# Patient Record
Sex: Female | Born: 1955 | Marital: Married | Smoking: Never smoker
Health system: Southern US, Community
[De-identification: ages and names within clinical notes are randomized; demographics above are authoritative.]

## PROBLEM LIST (undated history)

## (undated) DIAGNOSIS — E119 Type 2 diabetes mellitus without complications: Secondary | ICD-10-CM

## (undated) HISTORY — PX: SHOULDER SURGERY: SHX246

## (undated) HISTORY — DX: Type 2 diabetes mellitus without complications: E11.9

---

## 2012-05-05 ENCOUNTER — Encounter (HOSPITAL_COMMUNITY): Payer: Self-pay | Admitting: *Deleted

## 2012-05-05 ENCOUNTER — Emergency Department (HOSPITAL_COMMUNITY): Payer: Self-pay

## 2012-05-05 ENCOUNTER — Emergency Department (HOSPITAL_COMMUNITY)
Admission: EM | Admit: 2012-05-05 | Discharge: 2012-05-05 | Disposition: A | Discharge status: Home / Self Care | Payer: Self-pay | Attending: Emergency Medicine | Admitting: Emergency Medicine

## 2012-05-05 DIAGNOSIS — R05 Cough: Secondary | ICD-10-CM

## 2012-05-05 DIAGNOSIS — R053 Chronic cough: Secondary | ICD-10-CM

## 2012-05-05 DIAGNOSIS — R059 Cough, unspecified: Secondary | ICD-10-CM | POA: Insufficient documentation

## 2012-05-05 LAB — URINALYSIS, ROUTINE W REFLEX MICROSCOPIC
Bilirubin Urine: NEGATIVE
Glucose, UA: NEGATIVE mg/dL
Hgb urine dipstick: NEGATIVE
Ketones, ur: NEGATIVE mg/dL
Leukocytes, UA: NEGATIVE
Nitrite: NEGATIVE
Protein, ur: NEGATIVE mg/dL
Specific Gravity, Urine: 1.008 (ref 1.005–1.030)
Urobilinogen, UA: 0.2 mg/dL (ref 0.0–1.0)
pH: 5.5 (ref 5.0–8.0)

## 2012-05-05 LAB — COMPREHENSIVE METABOLIC PANEL WITH GFR
ALT: 18 U/L (ref 0–35)
AST: 31 U/L (ref 0–37)
Albumin: 4.3 g/dL (ref 3.5–5.2)
Alkaline Phosphatase: 77 U/L (ref 39–117)
BUN: 8 mg/dL (ref 6–23)
CO2: 22 meq/L (ref 19–32)
Calcium: 9.8 mg/dL (ref 8.4–10.5)
Chloride: 100 meq/L (ref 96–112)
Creatinine, Ser: 0.71 mg/dL (ref 0.50–1.10)
GFR calc Af Amer: >90 mL/min (ref >90)
GFR calc non Af Amer: >90 mL/min (ref >90)
Glucose, Bld: 98 mg/dL (ref 70–99)
Potassium: 4.3 meq/L (ref 3.5–5.1)
Sodium: 136 meq/L (ref 135–145)
Total Bilirubin: 0.7 mg/dL (ref 0.3–1.2)
Total Protein: 7.7 g/dL (ref 6.0–8.3)

## 2012-05-05 LAB — CBC WITH DIFFERENTIAL/PLATELET
Basophils Absolute: 0.1 10*3/uL (ref 0.0–0.1)
Basophils Relative: 1 % (ref 0–1)
Eosinophils Absolute: 1.1 10*3/uL — ABNORMAL HIGH (ref 0.0–0.7)
Eosinophils Relative: 12 % — ABNORMAL HIGH (ref 0–5)
HCT: 38.4 % (ref 36.0–46.0)
Hemoglobin: 13.8 g/dL (ref 12.0–15.0)
Lymphocytes Relative: 34 % (ref 12–46)
Lymphs Abs: 3.2 10*3/uL (ref 0.7–4.0)
MCH: 30.7 pg (ref 26.0–34.0)
MCHC: 35.9 g/dL (ref 30.0–36.0)
MCV: 85.3 fL (ref 78.0–100.0)
Monocytes Absolute: 0.5 10*3/uL (ref 0.1–1.0)
Monocytes Relative: 5 % (ref 3–12)
Neutro Abs: 4.6 10*3/uL (ref 1.7–7.7)
Neutrophils Relative %: 48 % (ref 43–77)
Platelets: 219 10*3/uL (ref 150–400)
RBC: 4.5 MIL/uL (ref 3.87–5.11)
RDW: 12.7 % (ref 11.5–15.5)
WBC: 9.4 10*3/uL (ref 4.0–10.5)

## 2012-05-05 MED ORDER — SODIUM CHLORIDE 0.9 % IV BOLUS (SEPSIS)
1000.0000 mL | Freq: Once | INTRAVENOUS | Status: AC
  Administered 2012-05-05: 1000 mL via INTRAVENOUS

## 2012-05-05 MED ORDER — IPRATROPIUM BROMIDE 0.02 % IN SOLN
0.5000 mg | Freq: Once | RESPIRATORY_TRACT | Status: AC
  Administered 2012-05-05: 0.5 mg via RESPIRATORY_TRACT
  Filled 2012-05-05: qty 2.5

## 2012-05-05 MED ORDER — PREDNISONE 50 MG PO TABS: 50.0000 mg | ORAL_TABLET | Freq: Every day | ORAL | Status: AC

## 2012-05-05 MED ORDER — PREDNISONE 20 MG PO TABS
60.0000 mg | ORAL_TABLET | Freq: Once | ORAL | Status: AC
  Administered 2012-05-05: 60 mg via ORAL
  Filled 2012-05-05: qty 3

## 2012-05-05 MED ORDER — ALBUTEROL SULFATE (5 MG/ML) 0.5% IN NEBU
5.0000 mg | INHALATION_SOLUTION | Freq: Once | RESPIRATORY_TRACT | Status: AC
  Administered 2012-05-05: 5 mg via RESPIRATORY_TRACT
  Filled 2012-05-05: qty 40

## 2012-05-05 MED ORDER — ALBUTEROL SULFATE HFA 108 (90 BASE) MCG/ACT IN AERS: 2.0000 | INHALATION_SPRAY | RESPIRATORY_TRACT | Status: DC | PRN

## 2012-05-05 MED ORDER — PROMETHAZINE-DM 6.25-15 MG/5ML PO SYRP: 5.0000 mL | ORAL_SOLUTION | Freq: Four times a day (QID) | ORAL | Status: AC | PRN

## 2012-05-05 MED ORDER — ESOMEPRAZOLE MAGNESIUM 40 MG PO CPDR: 40.0000 mg | DELAYED_RELEASE_CAPSULE | Freq: Every day | ORAL | Status: DC

## 2012-05-05 NOTE — ED Provider Notes (Signed)
History     CSN: [id number]  Arrival date & time 05/05/12  7829   First MD Initiated Contact with Patient 05/05/12 1002      Chief Complaint  Patient presents with  . Cough    (Consider location/radiation/quality/duration/timing/severity/associated sxs/prior treatment) HPI Patient has been having nonproductive cough for the last 3 months patient, states that she has not had any relief of antibiotics or cough suppressants.  Patient has tried over-the-counter remedies without relief.  Patient denies, chest pain, back pain, fever, nasal congestion, rhinorrhea, ear pain, nausea, and vomiting, diarrhea, abdominal pain, or dizziness.  History reviewed. No pertinent past medical history.  History reviewed. No pertinent past surgical history.  History reviewed. No pertinent family history.  History  Substance Use Topics  . Smoking status: Not on file  . Smokeless tobacco: Not on file  . Alcohol Use: No    OB History    Grav Para Term Preterm Abortions TAB SAB Ect Mult Living                  Review of Systems  Constitutional: Negative for fever, chills, diaphoresis and fatigue.  HENT: Negative for ear pain, nosebleeds, congestion, rhinorrhea, sneezing, neck pain, neck stiffness and postnasal drip.   Eyes: Negative for visual disturbance.  Respiratory: Positive for cough. Negative for apnea, choking, chest tightness, shortness of breath, wheezing and stridor.   Cardiovascular: Negative for chest pain.  Gastrointestinal: Negative for nausea, vomiting, abdominal pain, diarrhea and constipation.  Musculoskeletal: Positive for myalgias. Negative for back pain and arthralgias.  Skin: Negative for color change and rash.  Neurological: Negative for syncope, weakness, light-headedness and numbness.    Allergies  Review of patient's allergies indicates no known allergies.  Home Medications   Current Outpatient Rx  Name Route Sig Dispense Refill  . GUAIFENESIN ER 600 MG PO TB12  Oral Take 600 mg by mouth 2 (two) times daily as needed. For cough    . OVER THE COUNTER MEDICATION Oral Take 30 mLs by mouth 3 (three) times daily as needed. For cough      BP 123/80  Pulse 75  Temp 97.7 F (36.5 C) (Oral)  Resp 20  SpO2 99%  Physical Exam  Nursing note and vitals reviewed. Constitutional: She is oriented to person, place, and time. She appears well-developed and well-nourished.  HENT:  Head: Normocephalic and atraumatic.  Mouth/Throat: Oropharynx is clear and moist.  Neck: Normal range of motion. Neck supple.  Cardiovascular: Normal rate, regular rhythm and normal heart sounds.  Exam reveals no gallop and no friction rub.   No murmur heard. Pulmonary/Chest: Effort normal. No respiratory distress. She has no wheezes. She has no rales.       Patient has some mild congestive type sounds bilaterally. NO rhonchi or wheeze noted.  Neurological: She is alert and oriented to person, place, and time.  Skin: Skin is warm and dry. No rash noted.    ED Course  Procedures (including critical care time)  Labs Reviewed  CBC WITH DIFFERENTIAL - Abnormal; Notable for the following:    Eosinophils Relative 12 (*)     Eosinophils Absolute 1.1 (*)     All other components within normal limits  COMPREHENSIVE METABOLIC PANEL  URINALYSIS, ROUTINE W REFLEX MICROSCOPIC   Dg Chest 2 View  05/05/2012  *RADIOLOGY REPORT*  Clinical Data: Cough and chest pain off and on for the past 2-3 months.  CHEST - 2 VIEW  Comparison: No priors.  Findings: Lung volumes  are normal.  No consolidative airspace disease.  No pleural effusions.  No pneumothorax.  No pulmonary nodule or mass noted.  Pulmonary vasculature and the cardiomediastinal silhouette are within normal limits.  IMPRESSION: 1. No radiographic evidence of acute cardiopulmonary disease.  Original Report Authenticated By: Florencia Reasons, M.D.     Patient is referred to Pulmonary for further care and evaluation. She is treated with  albuterol steroids and nexium. Patient is advised to return here. The plan is given to the patient and all questions answered.  MDM    Date: 05/09/2012  Rate:67  Rhythm: normal sinus rhythm  QRS Axis: normal  Intervals: normal  ST/T Wave abnormalities: normal  Conduction Disutrbances:none  Narrative Interpretation:   Old EKG Reviewed: none available         Carlyle Dolly, PA-C 05/09/12 [phone]

## 2012-05-05 NOTE — ED Notes (Signed)
Pt reports having non productive cough x 3 months. Has become severe, having urinary incontinence when coughing. Airway is intact at triage, spo2 97%.

## 2012-05-16 NOTE — ED Provider Notes (Signed)
Medical screening examination/treatment/procedure(s) were performed by non-physician practitioner and as supervising physician I was immediately available for consultation/collaboration.  Lasha Echeverria M Darcey Demma, MD 05/16/12 1930 

## 2013-08-10 IMAGING — CR DG CHEST 2V
2 series · 2 of 2 positions shown · non-contrast
Comparison: No priors.

CLINICAL DATA: Cough and chest pain off and on for the past 2-3
months.

CHEST - 2 VIEW

[w chest pa]
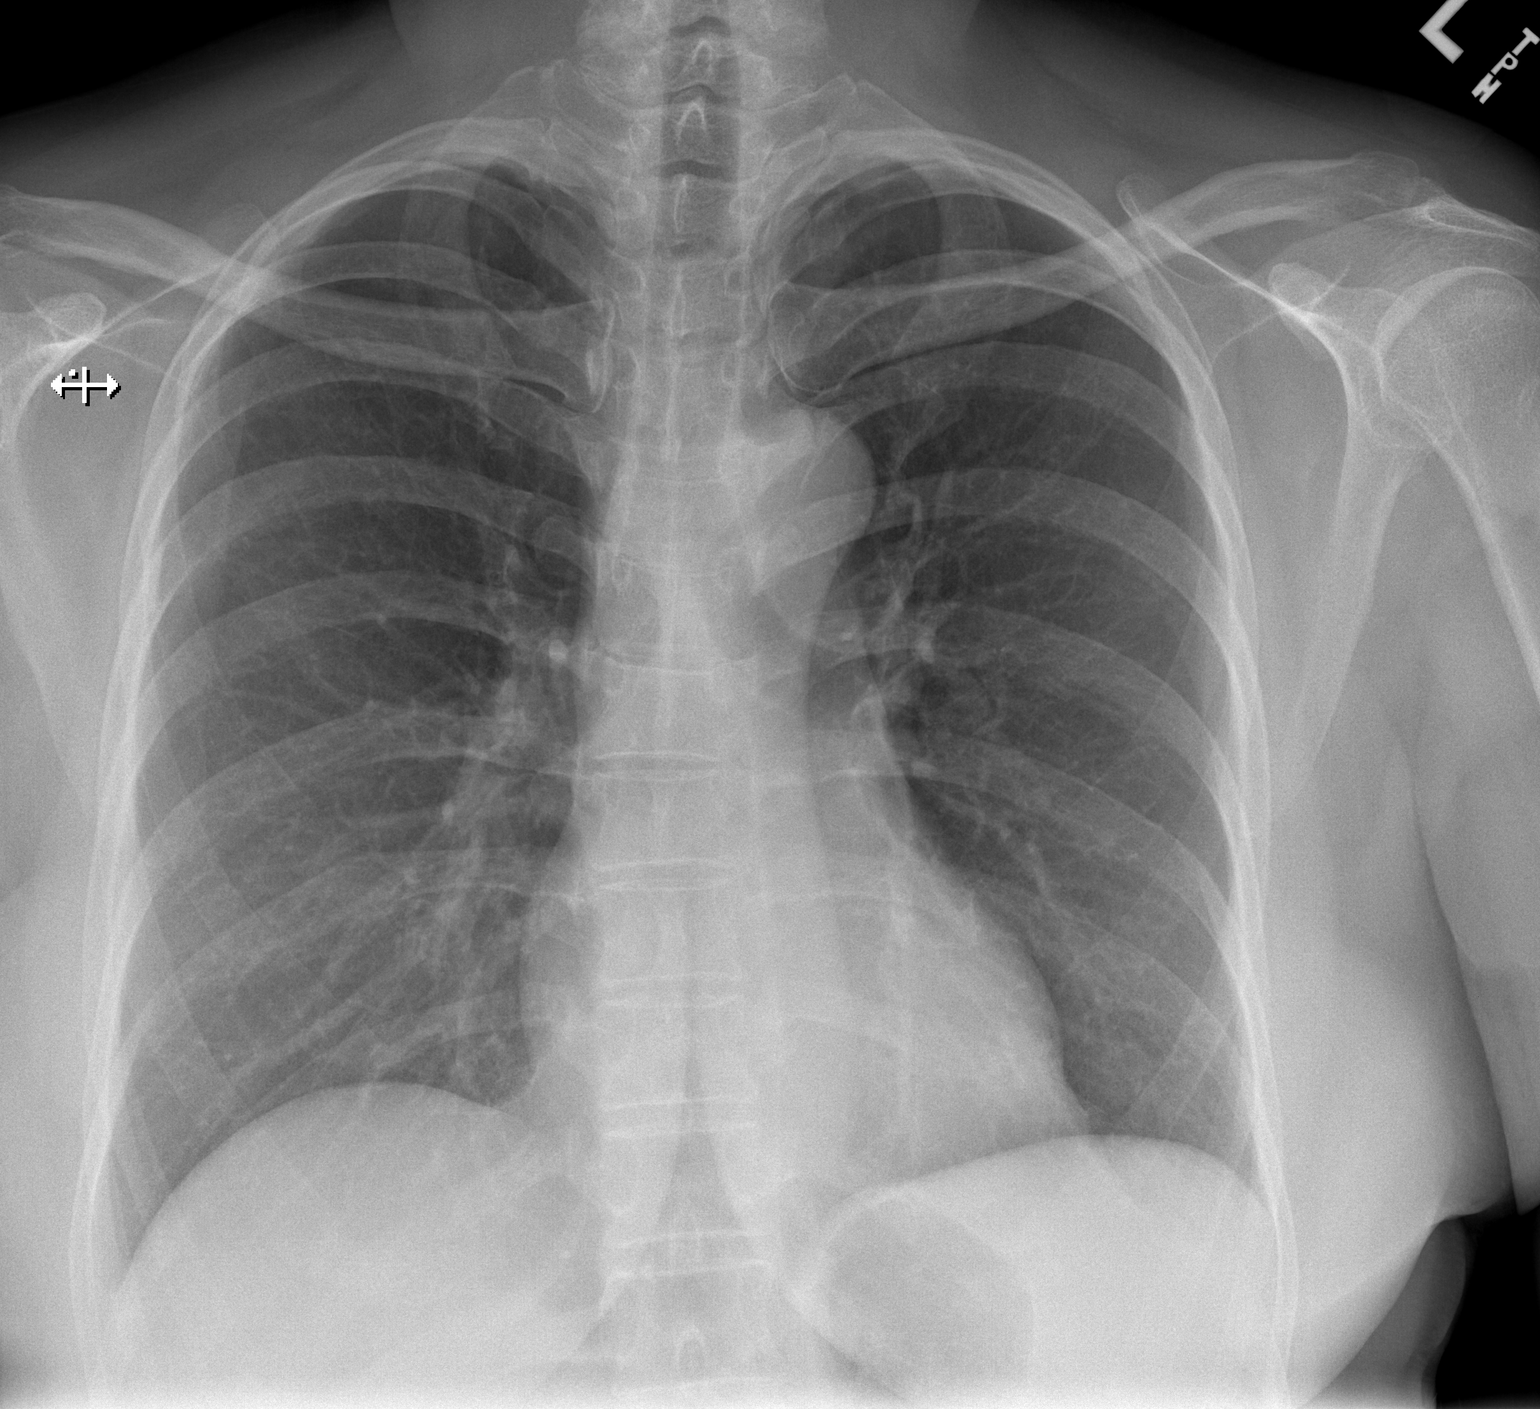

[w chest lat]
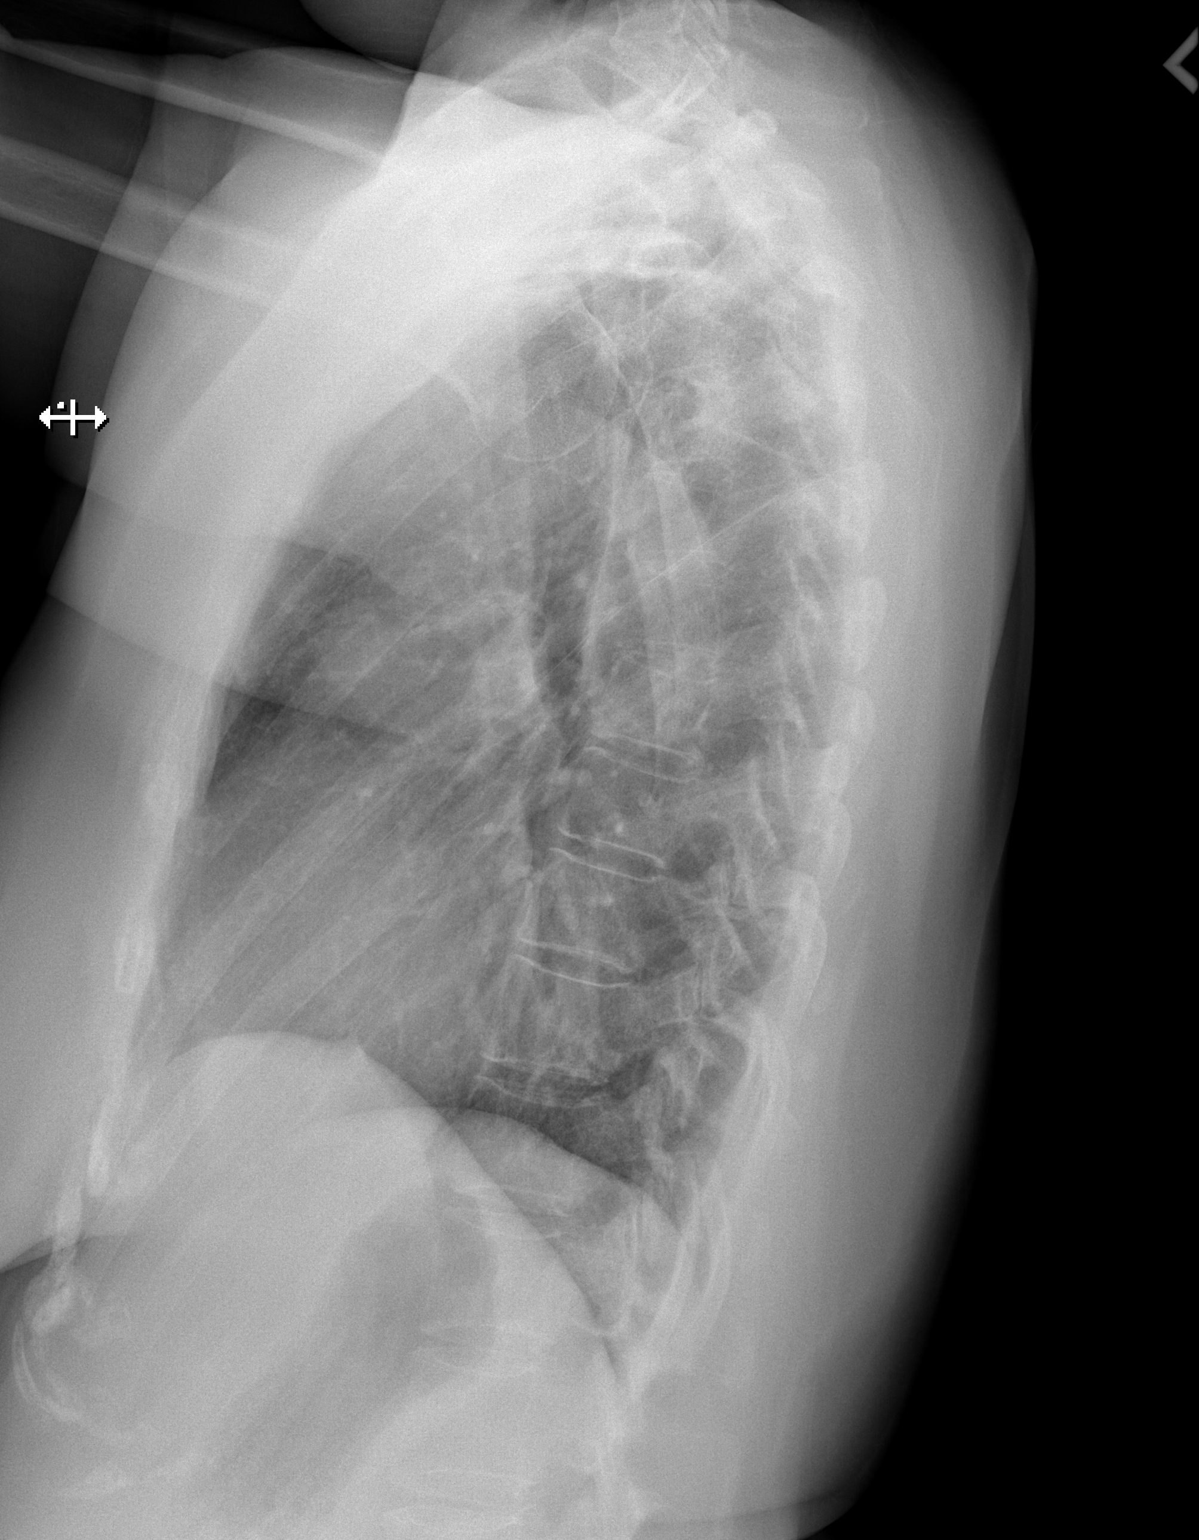

[2 of 2 positions shown; findings below may reference images not displayed]

FINDINGS: Lung volumes are normal.  No consolidative airspace
disease.  No pleural effusions.  No pneumothorax.  No pulmonary
nodule or mass noted.  Pulmonary vasculature and the
cardiomediastinal silhouette are within normal limits.
IMPRESSION: 1. No radiographic evidence of acute cardiopulmonary disease.

## 2015-04-24 ENCOUNTER — Ambulatory Visit: Payer: Self-pay

## 2015-05-19 ENCOUNTER — Ambulatory Visit: Payer: Self-pay | Attending: Internal Medicine

## 2015-07-13 ENCOUNTER — Ambulatory Visit (INDEPENDENT_AMBULATORY_CARE_PROVIDER_SITE_OTHER): Payer: Self-pay | Admitting: Family Medicine

## 2015-07-13 ENCOUNTER — Encounter: Payer: Self-pay | Admitting: Family Medicine

## 2015-07-13 VITALS — BP 125/79 | HR 72 | Temp 98.2°F | Resp 16 | Ht 63.0 in | Wt 139.0 lb

## 2015-07-13 DIAGNOSIS — Z862 Personal history of diseases of the blood and blood-forming organs and certain disorders involving the immune mechanism: Secondary | ICD-10-CM | POA: Insufficient documentation

## 2015-07-13 DIAGNOSIS — E039 Hypothyroidism, unspecified: Secondary | ICD-10-CM | POA: Insufficient documentation

## 2015-07-13 DIAGNOSIS — Z23 Encounter for immunization: Secondary | ICD-10-CM

## 2015-07-13 DIAGNOSIS — Z789 Other specified health status: Secondary | ICD-10-CM

## 2015-07-13 DIAGNOSIS — J321 Chronic frontal sinusitis: Secondary | ICD-10-CM

## 2015-07-13 DIAGNOSIS — R1011 Right upper quadrant pain: Secondary | ICD-10-CM | POA: Insufficient documentation

## 2015-07-13 DIAGNOSIS — E119 Type 2 diabetes mellitus without complications: Secondary | ICD-10-CM | POA: Insufficient documentation

## 2015-07-13 DIAGNOSIS — Z8669 Personal history of other diseases of the nervous system and sense organs: Secondary | ICD-10-CM

## 2015-07-13 DIAGNOSIS — E1110 Type 2 diabetes mellitus with ketoacidosis without coma: Secondary | ICD-10-CM | POA: Insufficient documentation

## 2015-07-13 DIAGNOSIS — E1310 Other specified diabetes mellitus with ketoacidosis without coma: Secondary | ICD-10-CM

## 2015-07-13 DIAGNOSIS — G500 Trigeminal neuralgia: Secondary | ICD-10-CM | POA: Insufficient documentation

## 2015-07-13 LAB — POCT URINALYSIS DIP (DEVICE)
Bilirubin Urine: NEGATIVE
GLUCOSE, UA: NEGATIVE mg/dL
Hgb urine dipstick: NEGATIVE
KETONES UR: NEGATIVE mg/dL
Leukocytes, UA: NEGATIVE
Nitrite: NEGATIVE
PROTEIN: NEGATIVE mg/dL
Urobilinogen, UA: 0.2 mg/dL (ref 0.0–1.0)
pH: 5 (ref 5.0–8.0)

## 2015-07-13 LAB — COMPLETE METABOLIC PANEL WITH GFR
ALBUMIN: 4.3 g/dL (ref 3.6–5.1)
ALK PHOS: 101 U/L (ref 33–130)
ALT: 28 U/L (ref 6–29)
AST: 34 U/L (ref 10–35)
BUN: 12 mg/dL (ref 7–25)
CHLORIDE: 104 mmol/L (ref 98–110)
CO2: 26 mmol/L (ref 20–31)
Calcium: 9.6 mg/dL (ref 8.6–10.4)
Creat: 0.6 mg/dL (ref 0.50–1.05)
GFR, Est African American: >89 mL/min (ref >=60)
GLUCOSE: 161 mg/dL — AB (ref 65–99)
POTASSIUM: 4.2 mmol/L (ref 3.5–5.3)
SODIUM: 137 mmol/L (ref 135–146)
Total Bilirubin: 1 mg/dL (ref 0.2–1.2)
Total Protein: 7.3 g/dL (ref 6.1–8.1)

## 2015-07-13 LAB — CBC WITH DIFFERENTIAL/PLATELET
Basophils Absolute: 0 10*3/uL (ref 0.0–0.1)
Basophils Relative: 0 % (ref 0–1)
EOS ABS: 0.3 10*3/uL (ref 0.0–0.7)
EOS PCT: 4 % (ref 0–5)
HEMATOCRIT: 41.8 % (ref 36.0–46.0)
Hemoglobin: 14.8 g/dL (ref 12.0–15.0)
LYMPHS ABS: 3.4 10*3/uL (ref 0.7–4.0)
LYMPHS PCT: 43 % (ref 12–46)
MCH: 31.2 pg (ref 26.0–34.0)
MCHC: 35.4 g/dL (ref 30.0–36.0)
MCV: 88 fL (ref 78.0–100.0)
MONO ABS: 0.5 10*3/uL (ref 0.1–1.0)
MPV: 10.4 fL (ref 8.6–12.4)
Monocytes Relative: 7 % (ref 3–12)
Neutro Abs: 3.6 10*3/uL (ref 1.7–7.7)
Neutrophils Relative %: 46 % (ref 43–77)
Platelets: 202 10*3/uL (ref 150–400)
RBC: 4.75 MIL/uL (ref 3.87–5.11)
RDW: 13.8 % (ref 11.5–15.5)
WBC: 7.8 10*3/uL (ref 4.0–10.5)

## 2015-07-13 LAB — LIPID PANEL
CHOL/HDL RATIO: 4.5 ratio (ref <=5.0)
Cholesterol: 181 mg/dL (ref 125–200)
HDL: 40 mg/dL — AB (ref >=46)
LDL Cholesterol: 113 mg/dL (ref <130)
Triglycerides: 138 mg/dL (ref <150)
VLDL: 28 mg/dL (ref <30)

## 2015-07-13 MED ORDER — GLUCOSE BLOOD VI STRP: ORAL_STRIP | Status: DC

## 2015-07-13 MED ORDER — TRUE METRIX AIR GLUCOSE METER DEVI: 1.0000 | Freq: Every day | Status: AC

## 2015-07-13 MED ORDER — AMOXICILLIN-POT CLAVULANATE 875-125 MG PO TABS: 1.0000 | ORAL_TABLET | Freq: Two times a day (BID) | ORAL | Status: DC

## 2015-07-13 NOTE — Patient Instructions (Signed)
Recommend a lowfat, low carbohydrate diet divided over 5-6 small meals, increase water intake to 6-8 glasses, and 150 minutes per week of cardiovascular exercise.   Check blood sugars daily fasting and bring to 1 month follow-up.  Follow up in 1 month.  Diabetes and Exercise Exercising regularly is important. It is not just about losing weight. It has many health benefits, such as:  Improving your overall fitness, flexibility, and endurance.  Increasing your bone density.  Helping with weight control.  Decreasing your body fat.  Increasing your muscle strength.  Reducing stress and tension.  Improving your overall health. People with diabetes who exercise gain additional benefits because exercise:  Reduces appetite.  Improves the body's use of blood sugar (glucose).  Helps lower or control blood glucose.  Decreases blood pressure.  Helps control blood lipids (such as cholesterol and triglycerides).  Improves the body's use of the hormone insulin by:  Increasing the body's insulin sensitivity.  Reducing the body's insulin needs.  Decreases the risk for heart disease because exercising:  Lowers cholesterol and triglycerides levels.  Increases the levels of good cholesterol (such as high-density lipoproteins [HDL]) in the body.  Lowers blood glucose levels. YOUR ACTIVITY PLAN  Choose an activity that you enjoy and set realistic goals. Your health care provider or diabetes educator can help you make an activity plan that works for you. Exercise regularly as directed by your health care provider. This includes:  Performing resistance training twice a week such as push-ups, sit-ups, lifting weights, or using resistance bands.  Performing 150 minutes of cardio exercises each week such as walking, running, or playing sports.  Staying active and spending no more than 90 minutes at one time being inactive. Even short bursts of exercise are good for you. Three 10-minute  sessions spread throughout the day are just as beneficial as a single 30-minute session. Some exercise ideas include:  Taking the dog for a walk.  Taking the stairs instead of the elevator.  Dancing to your favorite song.  Doing an exercise video.  Doing your favorite exercise with a friend. RECOMMENDATIONS FOR EXERCISING WITH TYPE 1 OR TYPE 2 DIABETES   Check your blood glucose before exercising. If blood glucose levels are greater than 240 mg/dL, check for urine ketones. Do not exercise if ketones are present.  Avoid injecting insulin into areas of the body that are going to be exercised. For example, avoid injecting insulin into:  The arms when playing tennis.  The legs when jogging.  Keep a record of:  Food intake before and after you exercise.  Expected peak times of insulin action.  Blood glucose levels before and after you exercise.  The type and amount of exercise you have done.  Review your records with your health care provider. Your health care provider will help you to develop guidelines for adjusting food intake and insulin amounts before and after exercising.  If you take insulin or oral hypoglycemic agents, watch for signs and symptoms of hypoglycemia. They include:  Dizziness.  Shaking.  Sweating.  Chills.  Confusion.  Drink plenty of water while you exercise to prevent dehydration or heat stroke. Body water is lost during exercise and must be replaced.  Talk to your health care provider before starting an exercise program to make sure it is safe for you. Remember, almost any type of activity is better than none. Document Released: 12/24/2003 Document Revised: 02/17/2014 Document Reviewed: 03/12/2013 [facility] Patient Information 2015 [place], [place]. This information is not intended  to replace advice given to you by your health care provider. Make sure you discuss any questions you have with your health care provider. Sinusitis Sinusitis is redness,  soreness, and puffiness (inflammation) of the air pockets in the bones of your face (sinuses). The redness, soreness, and puffiness can cause air and mucus to get trapped in your sinuses. This can allow germs to grow and cause an infection.  HOME CARE   Drink enough fluids to keep your pee (urine) clear or pale yellow.  Use a humidifier in your home.  Run a hot shower to create steam in the bathroom. Sit in the bathroom with the door closed. Breathe in the steam 3-4 times a day.  Put a warm, moist washcloth on your face 3-4 times a day, or as told by your doctor.  Use salt water sprays (saline sprays) to wet the thick fluid in your nose. This can help the sinuses drain.  Only take medicine as told by your doctor. GET HELP RIGHT AWAY IF:   Your pain gets worse.  You have very bad headaches.  You are sick to your stomach (nauseous).  You throw up (vomit).  You are very sleepy (drowsy) all the time.  Your face is puffy (swollen).  Your vision changes.  You have a stiff neck.  You have trouble breathing. MAKE SURE YOU:   Understand these instructions.  Will watch your condition.  Will get help right away if you are not doing well or get worse. Document Released: 03/21/2008 Document Revised: 06/27/2012 Document Reviewed: 05/08/2012 [facility] Patient Information 2015 [place], [place]. This information is not intended to replace advice given to you by your health care provider. Make sure you discuss any questions you have with your health care provider. Hypothyroidism The thyroid is a large gland located in the lower front of your neck. The thyroid gland helps control metabolism. Metabolism is how your body handles food. It controls metabolism with the hormone thyroxine. When this gland is underactive (hypothyroid), it produces too little hormone.  CAUSES These include:   Absence or destruction of thyroid tissue.  Goiter due to iodine deficiency.  Goiter due to  medications.  Congenital defects (since birth).  Problems with the pituitary. This causes a lack of TSH (thyroid stimulating hormone). This hormone tells the thyroid to turn out more hormone. SYMPTOMS  Lethargy (feeling as though you have no energy)  Cold intolerance  Weight gain (in spite of normal food intake)  Dry skin  Coarse hair  Menstrual irregularity (if severe, may lead to infertility)  Slowing of thought processes Cardiac problems are also caused by insufficient amounts of thyroid hormone. Hypothyroidism in the newborn is cretinism, and is an extreme form. It is important that this form be treated adequately and immediately or it will lead rapidly to retarded physical and mental development. DIAGNOSIS  To prove hypothyroidism, your caregiver may do blood tests and ultrasound tests. Sometimes the signs are hidden. It may be necessary for your caregiver to watch this illness with blood tests either before or after diagnosis and treatment. TREATMENT  Low levels of thyroid hormone are increased by using synthetic thyroid hormone. This is a safe, effective treatment. It usually takes about four weeks to gain the full effects of the medication. After you have the full effect of the medication, it will generally take another four weeks for problems to leave. Your caregiver may start you on low doses. If you have had heart problems the dose may be gradually  increased. It is generally not an emergency to get rapidly to normal. HOME CARE INSTRUCTIONS   Take your medications as your caregiver suggests. Let your caregiver know of any medications you are taking or start taking. Your caregiver will help you with dosage schedules.  As your condition improves, your dosage needs may increase. It will be necessary to have continuing blood tests as suggested by your caregiver.  Report all suspected medication side effects to your caregiver. SEEK MEDICAL CARE IF: Seek medical care if you  develop:  Sweating.  Tremulousness (tremors).  Anxiety.  Rapid weight loss.  Heat intolerance.  Emotional swings.  Diarrhea.  Weakness. SEEK IMMEDIATE MEDICAL CARE IF:  You develop chest pain, an irregular heart beat (palpitations), or a rapid heart beat. MAKE SURE YOU:   Understand these instructions.  Will watch your condition.  Will get help right away if you are not doing well or get worse. Document Released: 10/03/2005 Document Revised: 12/26/2011 Document Reviewed: 05/23/2008 [facility] Patient Information 2015 [place], [place]. This information is not intended to replace advice given to you by your health care provider. Make sure you discuss any questions you have with your health care provider.

## 2015-07-13 NOTE — Progress Notes (Signed)
Subjective:    Patient ID: Denise Christensen, female    DOB: [birth year], 59 y.o.   MRN: [id number]  HPI Denise Christensen, a 59 year old female presents accompanied by husband and niece to establish care. Patient primarily speaks [place], so she requires interpreter to assist with communication. Patient relocated from [place] 1 year ago. She has not been established with a primary care provider since relocating. Patient has a history of diabetes mellitus type 2, hypothyroidism, history of migraine headaches and trigeminal neuralgia.  Patient complains of abdominal pain to the right upper quadrant. The pain is described as periodic and aching. She states that she is not having pain at present. Pain is located in the right upper quadrant and is non radiating. Onset was several months ago. Symptoms have been unchanged since. Aggravating factors include eating large meals The patient denies fever, fatigue, urinary symptoms,  nausea, vomiting, diarrhea, or constipation.  She states that she has not attempted any OTC interventions to alleviate symptoms.   Ms. Patient has a history of DMII. Patient denies foot ulcerations, paresthesia of the feet, visual disturbances, vomitting and weight loss.  Patient has not been evaluated for type 2 diabetes in greater than 3 months. She does not check blood sugars at home.   Patient also reports history of migraine headaches. She state that she takes Imitrex periodically for headaches with minimal relief. She states that pain is primarily above eyebrows. She states that frontal headache has been presenting periodically over the past few months. She states that she has pressure to her forehead. She reports periodic nasal congestion. Patient denies dizziness, numbness or tingling to extremities.  Past Medical History  Diagnosis Date  . Diabetes mellitus without complication   No Known Allergies    Medication List       This list is accurate as of: 07/13/15  7:05 PM.   Always use your most recent med list.               albuterol 108 (90 BASE) MCG/ACT inhaler  Commonly known as:  PROVENTIL HFA;VENTOLIN HFA  Inhale 2 puffs into the lungs every 4 (four) hours as needed for wheezing.     amitriptyline 25 MG tablet  Commonly known as:  ELAVIL  Take 25 mg by mouth at bedtime.     amoxicillin-clavulanate 875-125 MG per tablet  Commonly known as:  AUGMENTIN  Take 1 tablet by mouth 2 (two) times daily.     esomeprazole 40 MG capsule  Commonly known as:  NEXIUM  Take 1 capsule (40 mg total) by mouth daily.     glucose blood test strip  Commonly known as:  TRUE METRIX BLOOD GLUCOSE TEST  Use as instructed     guaiFENesin 600 MG 12 hr tablet  Commonly known as:  MUCINEX  Take 600 mg by mouth 2 (two) times daily as needed. For cough     levothyroxine 25 MCG tablet  Commonly known as:  SYNTHROID, LEVOTHROID  Take 25 mcg by mouth daily before breakfast.     metFORMIN 1000 MG tablet  Commonly known as:  GLUCOPHAGE  Take 1,000 mg by mouth daily with breakfast.     OVER THE COUNTER MEDICATION  Take 30 mLs by mouth 3 (three) times daily as needed. For cough     SUMAtriptan 50 MG tablet  Commonly known as:  IMITREX  Take 50 mg by mouth every 2 (two) hours as needed for migraine. May repeat in 2 hours if  headache persists or recurs.     topiramate 50 MG tablet  Commonly known as:  TOPAMAX  Take 50 mg by mouth 2 (two) times daily.     TRUE METRIX AIR GLUCOSE METER Devi  1 each by Does not apply route daily.       Immunization History  Administered Date(s) Administered  . Influenza,inj,Quad PF,36+ Mos 07/13/2015  No Known Allergies  Review of Systems  Constitutional: Positive for fatigue.  HENT: Positive for sinus pressure.   Eyes: Negative.   Respiratory: Negative.   Cardiovascular: Negative.   Gastrointestinal: Positive for abdominal pain. Negative for nausea.  Endocrine: Positive for polydipsia and polyuria. Negative for polyphagia.   Genitourinary: Negative.   Musculoskeletal: Negative.   Skin: Negative.   Allergic/Immunologic: Negative for immunocompromised state.  Neurological: Negative for dizziness and light-headedness.  Hematological: Negative.   Psychiatric/Behavioral: Negative.        Objective:   Physical Exam  Constitutional: She appears well-developed and well-nourished. She is active.  Non-toxic appearance. She does not have a sickly appearance. No distress.  HENT:  Head: Normocephalic. Head is with raccoon's eyes. Hair is normal.  Right Ear: External ear normal.  Nose: Sinus tenderness present. Right sinus exhibits frontal sinus tenderness. Left sinus exhibits frontal sinus tenderness.  Mouth/Throat: Oropharynx is clear and moist.  Eyes: EOM and lids are normal. Pupils are equal, round, and reactive to light.  Neck: Normal range of motion. Neck supple.  Cardiovascular: Normal rate, normal heart sounds and intact distal pulses.   Pulmonary/Chest: Effort normal and breath sounds normal.  Abdominal: Soft. Bowel sounds are normal.  Musculoskeletal: Normal range of motion.  Neurological: She is alert.  Monofilament exam normal  Skin: Skin is warm, dry and intact.  Hyperpigmentation below eyes  Psychiatric: She has a normal mood and affect. Her behavior is normal. Judgment and thought content normal.       BP 125/79 mmHg  Pulse 72  Temp(Src) 98.2 F (36.8 C) (Oral)  Resp 16  Ht  (1.6 m)  Wt 139 lb (63.05 kg)  BMI 24.63 kg/m2 Assessment & Plan:   1. Type 2 diabetes mellitus without complication Recommend a lowfat, low carbohydrate diet divided over 5-6 small meals, increase water intake to 6-8 glasses, and 150 minutes per week of cardiovascular exercise. Check blood sugars every morning prior to breakfast.   - metFORMIN (GLUCOPHAGE) 1000 MG tablet; Take 1,000 mg by mouth daily with breakfast. - POCT urinalysis dipstick - Hemoglobin A1c - COMPLETE METABOLIC PANEL WITH GFR - Lipid  Panel - Blood Glucose Monitoring Suppl (TRUE METRIX AIR GLUCOSE METER) DEVI; 1 each by Does not apply route daily.  Dispense: 1 Device; Refill: 0 - glucose blood (TRUE METRIX BLOOD GLUCOSE TEST) test strip; Use as instructed  Dispense: 50 each; Refill: 12 - POCT urinalysis dip (device)  2. Abdominal pain Patient complains of periodic abdominal pain. Right upper quadrant pain was not reproducible during physical examination. Will check CMP and urinalysis. Recommend a lowfat diet divided over 5-6 small meals.   - COMPLETE METABOLIC PANEL WITH GFR  3. Hypothyroidism, unspecified hypothyroidism type Continue present dose of levothyroxine as previously prescribed. Will check TSH.   - levothyroxine (SYNTHROID, LEVOTHROID) 25 MCG tablet; Take 25 mcg by mouth daily before breakfast. - TSH  4. History of anemia Patient reports history of blood transfusions due to anemia. Will check CBC w/differential  - CBC with Differential  5. Chronic frontal sinusitis Patient has frontal sinus pressure and tenderness to  palpation. Will start course of antibiotics.  - amoxicillin-clavulanate (AUGMENTIN) 875-125 MG per tablet; Take 1 tablet by mouth 2 (two) times daily.  Dispense: 20 tablet; Refill: 0  6. History of migraine headache Continue current medications as previously prescribed. Maintain a headache diary. Will follow up in 1 month for migraines.  - SUMAtriptan (IMITREX) 50 MG tablet; Take 50 mg by mouth every 2 (two) hours as needed for migraine. May repeat in 2 hours if headache persists or recurs. - topiramate (TOPAMAX) 50 MG tablet; Take 50 mg by mouth 2 (two) times daily.  7. Trigeminal neuralgia pain Continue medications as previously prescribed  - amitriptyline (ELAVIL) 25 MG tablet; Take 25 mg by mouth at bedtime.  8. Need for prophylactic vaccination and inoculation against influenza  - Flu Vaccine QUAD 36+ mos PF IM (Fluarix & Fluzone Quad PF)  9. Language barrier to  communication Primary language is [place]. Patient needs interpreter to assist with communication    Preventative care:  Patient has never had eye exam. Recommend yearly eye exam Recommend screening mammogram Recommend pap smear   RTC: 1 month for follow up  Massie Maroon, FNP

## 2015-07-14 LAB — HEMOGLOBIN A1C
HEMOGLOBIN A1C: 9.1 % — AB (ref <5.7)
MEAN PLASMA GLUCOSE: 214 mg/dL — AB (ref <117)

## 2015-07-14 LAB — TSH: TSH: 2.532 u[IU]/mL (ref 0.350–4.500)

## 2015-07-15 ENCOUNTER — Telehealth: Payer: Self-pay | Admitting: Family Medicine

## 2015-07-15 DIAGNOSIS — IMO0002 Reserved for concepts with insufficient information to code with codable children: Secondary | ICD-10-CM

## 2015-07-15 DIAGNOSIS — E1165 Type 2 diabetes mellitus with hyperglycemia: Secondary | ICD-10-CM

## 2015-07-15 MED ORDER — GLIPIZIDE 5 MG PO TABS: 5.0000 mg | ORAL_TABLET | Freq: Every day | ORAL | Status: DC

## 2015-07-15 NOTE — Telephone Encounter (Signed)
Meds ordered this encounter  Medications  . glipiZIDE (GLUCOTROL) 5 MG tablet    Sig: Take 1 tablet (5 mg total) by mouth daily before breakfast.    Dispense:  30 tablet    Refill:  0  Hemoglobin A1c is currently 9.4% on Metformin 1000 mg BID, will add glipizide 5 mg. Check blood sugars as discussed during appointment. Recommend a lowfat, low carbohydrate diet divided over 5-6 small meals, increase water intake to 6-8 glasses, and 3 times per week for 30 minutes.    Massie Maroon, FNP

## 2015-08-11 ENCOUNTER — Ambulatory Visit: Payer: Self-pay | Admitting: Family Medicine

## 2015-08-19 ENCOUNTER — Ambulatory Visit: Payer: Self-pay | Admitting: Family Medicine

## 2015-09-02 ENCOUNTER — Encounter: Payer: Self-pay | Admitting: Family Medicine

## 2015-09-02 ENCOUNTER — Ambulatory Visit (INDEPENDENT_AMBULATORY_CARE_PROVIDER_SITE_OTHER): Payer: Self-pay | Admitting: Family Medicine

## 2015-09-02 VITALS — BP 104/69 | HR 77 | Temp 97.6°F | Resp 16 | Ht 63.0 in | Wt 143.0 lb

## 2015-09-02 DIAGNOSIS — Z789 Other specified health status: Secondary | ICD-10-CM

## 2015-09-02 DIAGNOSIS — E1310 Other specified diabetes mellitus with ketoacidosis without coma: Secondary | ICD-10-CM

## 2015-09-02 DIAGNOSIS — L259 Unspecified contact dermatitis, unspecified cause: Secondary | ICD-10-CM

## 2015-09-02 DIAGNOSIS — Z862 Personal history of diseases of the blood and blood-forming organs and certain disorders involving the immune mechanism: Secondary | ICD-10-CM

## 2015-09-02 DIAGNOSIS — E1110 Type 2 diabetes mellitus with ketoacidosis without coma: Secondary | ICD-10-CM

## 2015-09-02 DIAGNOSIS — L309 Dermatitis, unspecified: Secondary | ICD-10-CM

## 2015-09-02 LAB — GLUCOSE, CAPILLARY: Glucose-Capillary: 207 mg/dL — ABNORMAL HIGH (ref 65–99)

## 2015-09-02 LAB — BASIC METABOLIC PANEL
BUN: 10 mg/dL (ref 7–25)
CALCIUM: 9.3 mg/dL (ref 8.6–10.4)
CO2: 24 mmol/L (ref 20–31)
Chloride: 102 mmol/L (ref 98–110)
Creat: 0.6 mg/dL (ref 0.50–1.05)
Glucose, Bld: 201 mg/dL — ABNORMAL HIGH (ref 65–99)
Potassium: 4 mmol/L (ref 3.5–5.3)
Sodium: 135 mmol/L (ref 135–146)

## 2015-09-02 LAB — POCT URINALYSIS DIP (DEVICE)
Bilirubin Urine: NEGATIVE
GLUCOSE, UA: 500 mg/dL — AB
Hgb urine dipstick: NEGATIVE
KETONES UR: NEGATIVE mg/dL
Leukocytes, UA: NEGATIVE
Nitrite: NEGATIVE
PROTEIN: NEGATIVE mg/dL
SPECIFIC GRAVITY, URINE: 1.02 (ref 1.005–1.030)
Urobilinogen, UA: 0.2 mg/dL (ref 0.0–1.0)
pH: 5.5 (ref 5.0–8.0)

## 2015-09-02 MED ORDER — TRIAMCINOLONE ACETONIDE 40 MG/ML IJ SUSP
40.0000 mg | Freq: Once | INTRAMUSCULAR | Status: AC
  Administered 2015-09-02: 40 mg via INTRAMUSCULAR

## 2015-09-02 MED ORDER — DEXAMETHASONE SODIUM PHOSPHATE 4 MG/ML IJ SOLN
4.0000 mg | Freq: Once | INTRAMUSCULAR | Status: AC
  Administered 2015-09-02: 4 mg via INTRAMUSCULAR

## 2015-09-02 MED ORDER — DEXAMETHASONE SODIUM PHOSPHATE 4 MG/ML IJ SOLN: 4.0000 mg | Freq: Once | INTRAMUSCULAR | Status: DC

## 2015-09-02 NOTE — Patient Instructions (Addendum)
Dove Unscented Southern CompanyBody Wash. Pat dry/use luke warm water Aveeno Hypogenic lotion Dreft/ ALL clear (hypoallergenic) Increase water intake 6-8 glasses.  Continue check blood sugars. If level is greater than 400 report to the emergency    Will call in am to discuss labs otherwise follow up in 3 months.

## 2015-09-02 NOTE — Progress Notes (Signed)
Subjective:    Patient ID: Denise Christensen, female    DOB: 1956-05-04, 59 y.o.   MRN: [id number]  HPI [name], a 59 year old female presents accompanied by  niece to for follow-up. Patient primarily speaks [place], so she requires interpreter to assist with communication. Patient relocated from [place] 1 year ago. Ms. Patient has a history of DMII. Patient is taking Janumet from [place] [phone] three times per day after running out of the metformin and glipizide ordered at previous appointment. Patient has exceeded daily dosage by 50 %. She did not show for previous appointment, request refills or follow up in office. Patient denies foot ulcerations, paresthesia of the feet, visual disturbances, vomitting and weight loss. She checks blood sugars at home, which have varied.   Patient is also complaining of a wide spread rash for 2 weeks.  Patient complains of generalized rash. She describes rash as itching. She states that rash has not changed since initial distribution. Denies: arthralgia, congestion, cough, decrease in appetite, decrease in energy level, fever, headache, myalgia, nausea, sore throat and vomiting. Patient has not had previous evaluation of rash. She is using hydrocortisone cream with minimal relief.   Patient has not had contacts with similar rash. Patient has not identified precipitant. Patient denies new exposures (soaps, lotions, laundry detergents, foods, medications, plants, insects or animals.)  Past Medical History  Diagnosis Date  . Diabetes mellitus without complication   No Known Allergies    Medication List       This list is accurate as of: 09/02/15 11:05 AM.  Always use your most recent med list.               albuterol 108 (90 BASE) MCG/ACT inhaler  Commonly known as:  PROVENTIL HFA;VENTOLIN HFA  Inhale 2 puffs into the lungs every 4 (four) hours as needed for wheezing.     amitriptyline 25 MG tablet  Commonly known as:  ELAVIL  Take 25 mg by mouth  at bedtime.     amoxicillin-clavulanate 875-125 MG tablet  Commonly known as:  AUGMENTIN  Take 1 tablet by mouth 2 (two) times daily.     esomeprazole 40 MG capsule  Commonly known as:  NEXIUM  Take 1 capsule (40 mg total) by mouth daily.     glipiZIDE 5 MG tablet  Commonly known as:  GLUCOTROL  Take 1 tablet (5 mg total) by mouth daily before breakfast.     glucose blood test strip  Commonly known as:  TRUE METRIX BLOOD GLUCOSE TEST  Use as instructed     guaiFENesin 600 MG 12 hr tablet  Commonly known as:  MUCINEX  Take 600 mg by mouth 2 (two) times daily as needed. For cough     levothyroxine 25 MCG tablet  Commonly known as:  SYNTHROID, LEVOTHROID  Take 25 mcg by mouth daily before breakfast.     metFORMIN 1000 MG tablet  Commonly known as:  GLUCOPHAGE  Take 1,000 mg by mouth daily with breakfast.     OVER THE COUNTER MEDICATION  Take 30 mLs by mouth 3 (three) times daily as needed. For cough     SUMAtriptan 50 MG tablet  Commonly known as:  IMITREX  Take 50 mg by mouth every 2 (two) hours as needed for migraine. May repeat in 2 hours if headache persists or recurs.     topiramate 50 MG tablet  Commonly known as:  TOPAMAX  Take 50 mg by mouth 2 (two) times  daily.     TRUE METRIX AIR GLUCOSE METER Devi  1 each by Does not apply route daily.       Immunization History  Administered Date(s) Administered  . Influenza,inj,Quad PF,36+ Mos 07/13/2015  No Known Allergies  Review of Systems  HENT: Negative for sinus pressure.   Eyes: Negative.  Negative for visual disturbance.  Respiratory: Negative.   Cardiovascular: Negative.   Gastrointestinal: Negative for nausea.  Endocrine: Positive for polydipsia. Negative for polyphagia and polyuria.  Genitourinary: Negative.  Negative for hematuria, vaginal discharge and vaginal pain.  Musculoskeletal: Negative.   Skin: Positive for rash.  Allergic/Immunologic: Negative for immunocompromised state.  Neurological:  Negative for dizziness and light-headedness.  Hematological: Negative.   Psychiatric/Behavioral: Negative.  Negative for suicidal ideas and sleep disturbance.       Objective:   Physical Exam  Constitutional: She appears well-developed and well-nourished. She is active.  Non-toxic appearance. She does not have a sickly appearance. No distress.  HENT:  Head: Normocephalic.  Right Ear: Hearing, tympanic membrane, external ear and ear canal normal.  Left Ear: Hearing, tympanic membrane and external ear normal.  Mouth/Throat: Uvula is midline and oropharynx is clear and moist.  Eyes: EOM and lids are normal. Pupils are equal, round, and reactive to light.  Neck: Normal range of motion. Neck supple.  Cardiovascular: Normal rate, normal heart sounds and intact distal pulses.   Pulmonary/Chest: Effort normal and breath sounds normal.  Abdominal: Soft. Bowel sounds are normal.  Musculoskeletal: Normal range of motion.  Neurological: She is alert.  Monofilament exam normal  Skin: Skin is warm, dry and intact. Rash (erythematous) noted. Rash is macular.  Hyperpigmentation below eyes  Psychiatric: She has a normal mood and affect. Her behavior is normal. Judgment and thought content normal.       There were no vitals taken for this visit. Assessment & Plan:  1. Uncontrolled type 2 diabetes mellitus with ketoacidosis without coma, without long-term current use of insulin (HCC) Patient has been taking additional Janumet to control blood sugars without medical advice. Will stop medication today. Will evaluate for proteinuria and check BUN/creatinine and GFR prior to starting anti-diabetic medications. Will follow up with patient by phone on 09/02/2015.  - POCT urinalysis dipstick - Basic Metabolic Panel - Glucose (CBG)  2. Dermatitis Patient has a wide-spread, macular,erythematous rash. Recommend [employer] Unscented Body Wash. Pat dry/use luke warm water Aveeno Hypogenic lotion Dreft/ ALL clear  (hypoallergenic) Increase water intake 6-8 glasses.  - triamcinolone acetonide (KENALOG-40) injection 40 mg; Inject 1 mL (40 mg total) into the muscle once. - dexamethasone (DECADRON) injection 4 mg; Inject 1 mL (4 mg total) into the muscle once.  3. Language barrier to communication Requires interpreter to assist with communication  RTC: Will follow up by phone on tomorrow Massie MaroonHollis,Davey Bergsma M, FNP

## 2015-09-03 ENCOUNTER — Telehealth: Payer: Self-pay | Admitting: Family Medicine

## 2015-09-03 DIAGNOSIS — E119 Type 2 diabetes mellitus without complications: Secondary | ICD-10-CM

## 2015-09-03 DIAGNOSIS — IMO0001 Reserved for inherently not codable concepts without codable children: Secondary | ICD-10-CM

## 2015-09-03 DIAGNOSIS — L309 Dermatitis, unspecified: Secondary | ICD-10-CM

## 2015-09-03 DIAGNOSIS — E1165 Type 2 diabetes mellitus with hyperglycemia: Secondary | ICD-10-CM

## 2015-09-03 MED ORDER — GLIPIZIDE 5 MG PO TABS: 5.0000 mg | ORAL_TABLET | Freq: Every day | ORAL | Status: DC

## 2015-09-03 MED ORDER — METFORMIN HCL 1000 MG PO TABS: 1000.0000 mg | ORAL_TABLET | Freq: Two times a day (BID) | ORAL | Status: DC

## 2015-09-03 MED ORDER — HYDROCORTISONE 1 % EX LOTN: 1.0000 "application " | TOPICAL_LOTION | Freq: Two times a day (BID) | CUTANEOUS | Status: AC

## 2015-09-03 MED ORDER — METFORMIN HCL 1000 MG PO TABS: 1000.0000 mg | ORAL_TABLET | Freq: Every day | ORAL | Status: DC

## 2015-09-03 NOTE — Telephone Encounter (Signed)
Reviewed labs. No proteinuria or acute kidney injury. Will continue Glipizide daily and Metformin 1000 mg BID. Patient to follow up in 1 month.    Past Medical History  Diagnosis Date  . Diabetes mellitus without complication (HCC)    Meds ordered this encounter  Medications  . DISCONTD: metFORMIN (GLUCOPHAGE) 1000 MG tablet    Sig: Take 1 tablet (1,000 mg total) by mouth daily with breakfast.    Dispense:  60 tablet    Refill:  5  . glipiZIDE (GLUCOTROL) 5 MG tablet    Sig: Take 1 tablet (5 mg total) by mouth daily before breakfast.    Dispense:  30 tablet    Refill:  5  . metFORMIN (GLUCOPHAGE) 1000 MG tablet    Sig: Take 1 tablet (1,000 mg total) by mouth 2 (two) times daily with a meal.    Dispense:  180 tablet    Refill:  5    Ronasia Isola M, FNP

## 2015-09-30 ENCOUNTER — Ambulatory Visit: Payer: Self-pay | Admitting: Family Medicine

## 2015-10-13 ENCOUNTER — Ambulatory Visit (INDEPENDENT_AMBULATORY_CARE_PROVIDER_SITE_OTHER): Payer: Self-pay | Admitting: Family Medicine

## 2015-10-13 ENCOUNTER — Encounter: Payer: Self-pay | Admitting: Family Medicine

## 2015-10-13 VITALS — BP 117/74 | HR 64 | Temp 97.9°F | Resp 16 | Ht 63.0 in | Wt 141.0 lb

## 2015-10-13 DIAGNOSIS — E039 Hypothyroidism, unspecified: Secondary | ICD-10-CM

## 2015-10-13 DIAGNOSIS — E119 Type 2 diabetes mellitus without complications: Secondary | ICD-10-CM

## 2015-10-13 DIAGNOSIS — L299 Pruritus, unspecified: Secondary | ICD-10-CM

## 2015-10-13 DIAGNOSIS — E785 Hyperlipidemia, unspecified: Secondary | ICD-10-CM

## 2015-10-13 DIAGNOSIS — Z789 Other specified health status: Secondary | ICD-10-CM

## 2015-10-13 LAB — HEMOGLOBIN A1C
Hgb A1c MFr Bld: 7 % — ABNORMAL HIGH (ref <5.7)
MEAN PLASMA GLUCOSE: 154 mg/dL — AB (ref <117)

## 2015-10-13 LAB — COMPLETE METABOLIC PANEL WITH GFR
ALT: 26 U/L (ref 6–29)
AST: 34 U/L (ref 10–35)
Albumin: 4 g/dL (ref 3.6–5.1)
Alkaline Phosphatase: 68 U/L (ref 33–130)
BILIRUBIN TOTAL: 0.7 mg/dL (ref 0.2–1.2)
BUN: 11 mg/dL (ref 7–25)
CHLORIDE: 104 mmol/L (ref 98–110)
CO2: 22 mmol/L (ref 20–31)
Calcium: 9.6 mg/dL (ref 8.6–10.4)
Creat: 0.62 mg/dL (ref 0.50–1.05)
GLUCOSE: 157 mg/dL — AB (ref 65–99)
POTASSIUM: 4 mmol/L (ref 3.5–5.3)
SODIUM: 137 mmol/L (ref 135–146)
TOTAL PROTEIN: 6.7 g/dL (ref 6.1–8.1)

## 2015-10-13 LAB — POCT URINALYSIS DIP (DEVICE)
Bilirubin Urine: NEGATIVE
GLUCOSE, UA: NEGATIVE mg/dL
Hgb urine dipstick: NEGATIVE
KETONES UR: NEGATIVE mg/dL
LEUKOCYTES UA: NEGATIVE
Nitrite: NEGATIVE
PROTEIN: NEGATIVE mg/dL
UROBILINOGEN UA: 0.2 mg/dL (ref 0.0–1.0)
pH: 5.5 (ref 5.0–8.0)

## 2015-10-13 LAB — GLUCOSE, CAPILLARY: GLUCOSE-CAPILLARY: 150 mg/dL — AB (ref 65–99)

## 2015-10-13 LAB — LIPID PANEL
CHOL/HDL RATIO: 3.8 ratio (ref <=5.0)
CHOLESTEROL: 189 mg/dL (ref 125–200)
HDL: 50 mg/dL (ref >=46)
LDL Cholesterol: 118 mg/dL (ref <130)
TRIGLYCERIDES: 103 mg/dL (ref <150)
VLDL: 21 mg/dL (ref <30)

## 2015-10-13 LAB — TSH: TSH: 2.846 u[IU]/mL (ref 0.350–4.500)

## 2015-10-13 MED ORDER — GLUCOSE BLOOD VI STRP: ORAL_STRIP | Status: AC

## 2015-10-13 MED ORDER — OMEGA-3-ACID ETHYL ESTERS 1 G PO CAPS: 1.0000 g | ORAL_CAPSULE | Freq: Two times a day (BID) | ORAL | Status: AC

## 2015-10-13 MED ORDER — HYDROXYZINE HCL 10 MG PO TABS: 10.0000 mg | ORAL_TABLET | Freq: Three times a day (TID) | ORAL | Status: DC | PRN

## 2015-10-13 NOTE — Patient Instructions (Addendum)
Contact Dermatitis Dermatitis is redness, soreness, and swelling (inflammation) of the skin. Contact dermatitis is a reaction to certain substances that touch the skin. There are two types of contact dermatitis:   Irritant contact dermatitis. This type is caused by something that irritates your skin, such as dry hands from washing them too much. This type does not require previous exposure to the substance for a reaction to occur. This type is more common.  Allergic contact dermatitis. This type is caused by a substance that you are allergic to, such as a nickel allergy or poison ivy. This type only occurs if you have been exposed to the substance (allergen) before. Upon a repeat exposure, your body reacts to the substance. This type is less common. CAUSES  Many different substances can cause contact dermatitis. Irritant contact dermatitis is most commonly caused by exposure to:   Makeup.   Soaps.   Detergents.   Bleaches.   Acids.   Metal salts, such as nickel.  Allergic contact dermatitis is most commonly caused by exposure to:   Poisonous plants.   Chemicals.   Jewelry.   Latex.   Medicines.   Preservatives in products, such as clothing.  RISK FACTORS This condition is more likely to develop in:   People who have jobs that expose them to irritants or allergens.  People who have certain medical conditions, such as asthma or eczema.  SYMPTOMS  Symptoms of this condition may occur anywhere on your body where the irritant has touched you or is touched by you. Symptoms include:  Dryness or flaking.   Redness.   Cracks.   Itching.   Pain or a burning feeling.   Blisters.  Drainage of small amounts of blood or clear fluid from skin cracks. With allergic contact dermatitis, there may also be swelling in areas such as the eyelids, mouth, or genitals.  DIAGNOSIS  This condition is diagnosed with a medical history and physical exam. A patch skin test  may be performed to help determine the cause. If the condition is related to your job, you may need to see an occupational medicine specialist. TREATMENT Treatment for this condition includes figuring out what caused the reaction and protecting your skin from further contact. Treatment may also include:   Steroid creams or ointments. Oral steroid medicines may be needed in more severe cases.  Antibiotics or antibacterial ointments, if a skin infection is present.  Antihistamine lotion or an antihistamine taken by mouth to ease itching.  A bandage (dressing). HOME CARE INSTRUCTIONS Skin Care  Moisturize your skin as needed.   Apply cool compresses to the affected areas.  Try taking a bath with:  Epsom salts. Follow the instructions on the packaging. You can get these at your local pharmacy or grocery store.  Baking soda. Pour a small amount into the bath as directed by your health care provider.  Colloidal oatmeal. Follow the instructions on the packaging. You can get this at your local pharmacy or grocery store.  Try applying baking soda paste to your skin. Stir water into baking soda until it reaches a paste-like consistency.  Do not scratch your skin.  Bathe less frequently, such as every other day.  Bathe in lukewarm water. Avoid using hot water. Medicines  Take or apply over-the-counter and prescription medicines only as told by your health care provider.   If you were prescribed an antibiotic medicine, take or apply your antibiotic as told by your health care provider. Do not stop using the   antibiotic even if your condition starts to improve. General Instructions  Keep all follow-up visits as told by your health care provider. This is important.  Avoid the substance that caused your reaction. If you do not know what caused it, keep a journal to try to track what caused it. Write down:  What you eat.  What cosmetic products you use.  What you drink.  What  you wear in the affected area. This includes jewelry.  If you were given a dressing, take care of it as told by your health care provider. This includes when to change and remove it. SEEK MEDICAL CARE IF:   Your condition does not improve with treatment.  Your condition gets worse.  You have signs of infection such as swelling, tenderness, redness, soreness, or warmth in the affected area.  You have a fever.  You have new symptoms. SEEK IMMEDIATE MEDICAL CARE IF:   You have a severe headache, neck pain, or neck stiffness.  You vomit.  You feel very sleepy.  You notice red streaks coming from the affected area.  Your bone or joint underneath the affected area becomes painful after the skin has healed.  The affected area turns darker.  You have difficulty breathing.   This information is not intended to replace advice given to you by your health care provider. Make sure you discuss any questions you have with your health care provider.   Document Released: 09/30/2000 Document Revised: 06/24/2015 Document Reviewed: 02/18/2015 Elsevier Interactive Patient Education 2016 [employer] Basic Carbohydrate Counting for Diabetes Mellitus Carbohydrate counting is a method for keeping track of the amount of carbohydrates you eat. Eating carbohydrates naturally increases the level of sugar (glucose) in your blood, so it is important for you to know the amount that is okay for you to have in every meal. Carbohydrate counting helps keep the level of glucose in your blood within normal limits. The amount of carbohydrates allowed is different for every person. A dietitian can help you calculate the amount that is right for you. Once you know the amount of carbohydrates you can have, you can count the carbohydrates in the foods you want to eat. Carbohydrates are found in the following foods:  Grains, such as breads and cereals.  Dried beans and soy products.  Starchy vegetables, such as  potatoes, peas, and corn.  Fruit and fruit juices.  Milk and yogurt.  Sweets and snack foods, such as cake, cookies, candy, chips, soft drinks, and fruit drinks. CARBOHYDRATE COUNTING There are two ways to count the carbohydrates in your food. You can use either of the methods or a combination of both. Reading the "Nutrition Facts" on Packaged Food The "Nutrition Facts" is an area that is included on the labels of almost all packaged food and beverages in the [place]. It includes the serving size of that food or beverage and information about the nutrients in each serving of the food, including the grams (g) of carbohydrate per serving.  Decide the number of servings of this food or beverage that you will be able to eat or drink. Multiply that number of servings by the number of grams of carbohydrate that is listed on the label for that serving. The total will be the amount of carbohydrates you will be having when you eat or drink this food or beverage. Learning Standard Serving Sizes of Food When you eat food that is not packaged or does not include "Nutrition Facts" on the label, you need  to measure the servings in order to count the amount of carbohydrates.A serving of most carbohydrate-rich foods contains about 15 g of carbohydrates. The following list includes serving sizes of carbohydrate-rich foods that provide 15 g ofcarbohydrate per serving:   1 slice of bread (1 oz) or 1 six-inch tortilla.    of a hamburger bun or English muffin.  4-6 crackers.   cup unsweetened dry cereal.    cup hot cereal.   cup rice or pasta.    cup mashed potatoes or  of a large baked potato.  1 cup fresh fruit or one small piece of fruit.    cup canned or frozen fruit or fruit juice.  1 cup milk.   cup plain fat-free yogurt or yogurt sweetened with artificial sweeteners.   cup cooked dried beans or starchy vegetable, such as peas, corn, or potatoes.  Decide the number of  standard-size servings that you will eat. Multiply that number of servings by 15 (the grams of carbohydrates in that serving). For example, if you eat 2 cups of strawberries, you will have eaten 2 servings and 30 g of carbohydrates (2 servings x 15 g = 30 g). For foods such as soups and casseroles, in which more than one food is mixed in, you will need to count the carbohydrates in each food that is included. EXAMPLE OF CARBOHYDRATE COUNTING Sample Dinner  3 oz chicken breast.   cup of brown rice.   cup of corn.  1 cup milk.   1 cup strawberries with sugar-free whipped topping.  Carbohydrate Calculation Step 1: Identify the foods that contain carbohydrates:   Rice.   Corn.   Milk.   Strawberries. Step 2:Calculate the number of servings eaten of each:   2 servings of rice.   1 serving of corn.   1 serving of milk.   1 serving of strawberries. Step 3: Multiply each of those number of servings by 15 g:   2 servings of rice x 15 g = 30 g.   1 serving of corn x 15 g = 15 g.   1 serving of milk x 15 g = 15 g.   1 serving of strawberries x 15 g = 15 g. Step 4: Add together all of the amounts to find the total grams of carbohydrates eaten: 30 g + 15 g + 15 g + 15 g = 75 g.   This information is not intended to replace advice given to you by your health care provider. Make sure you discuss any questions you have with your health care provider.   Document Released: 10/03/2005 Document Revised: 10/24/2014 Document Reviewed: 08/30/2013 Elsevier Interactive Patient Education 2016 [employer]. Diabetes and Exercise Exercising regularly is important. It is not just about losing weight. It has many health benefits, such as:  Improving your overall fitness, flexibility, and endurance.  Increasing your bone density.  Helping with weight control.  Decreasing your body fat.  Increasing your muscle strength.  Reducing stress and tension.  Improving your  overall health. People with diabetes who exercise gain additional benefits because exercise:  Reduces appetite.  Improves the body's use of blood sugar (glucose).  Helps lower or control blood glucose.  Decreases blood pressure.  Helps control blood lipids (such as cholesterol and triglycerides).  Improves the body's use of the hormone insulin by:  Increasing the body's insulin sensitivity.  Reducing the body's insulin needs.  Decreases the risk for heart disease because exercising:  Lowers cholesterol and triglycerides  levels.  Increases the levels of good cholesterol (such as high-density lipoproteins [HDL]) in the body.  Lowers blood glucose levels. YOUR ACTIVITY PLAN  Choose an activity that you enjoy, and set realistic goals. To exercise safely, you should begin practicing any new physical activity slowly, and gradually increase the intensity of the exercise over time. Your health care provider or diabetes educator can help create an activity plan that works for you. General recommendations include:  Encouraging children to engage in at least 60 minutes of physical activity each day.  Stretching and performing strength training exercises, such as yoga or weight lifting, at least 2 times per week.  Performing a total of at least 150 minutes of moderate-intensity exercise each week, such as brisk walking or water aerobics.  Exercising at least 3 days per week, making sure you allow no more than 2 consecutive days to pass without exercising.  Avoiding long periods of inactivity (90 minutes or more). When you have to spend an extended period of time sitting down, take frequent breaks to walk or stretch. RECOMMENDATIONS FOR EXERCISING WITH TYPE 1 OR TYPE 2 DIABETES   Check your blood glucose before exercising. If blood glucose levels are greater than 240 mg/dL, check for urine ketones. Do not exercise if ketones are present.  Avoid injecting insulin into areas of the body  that are going to be exercised. For example, avoid injecting insulin into:  The arms when playing tennis.  The legs when jogging.  Keep a record of:  Food intake before and after you exercise.  Expected peak times of insulin action.  Blood glucose levels before and after you exercise.  The type and amount of exercise you have done.  Review your records with your health care provider. Your health care provider will help you to develop guidelines for adjusting food intake and insulin amounts before and after exercising.  If you take insulin or oral hypoglycemic agents, watch for signs and symptoms of hypoglycemia. They include:  Dizziness.  Shaking.  Sweating.  Chills.  Confusion.  Drink plenty of water while you exercise to prevent dehydration or heat stroke. Body water is lost during exercise and must be replaced.  Talk to your health care provider before starting an exercise program to make sure it is safe for you. Remember, almost any type of activity is better than none.   This information is not intended to replace advice given to you by your health care provider. Make sure you discuss any questions you have with your health care provider.   Document Released: 12/24/2003 Document Revised: 02/17/2015 Document Reviewed: 03/12/2013 Elsevier Interactive Patient Education 2016 [employer]. Diabetes Mellitus and Food It is important for you to manage your blood sugar (glucose) level. Your blood glucose level can be greatly affected by what you eat. Eating healthier foods in the appropriate amounts throughout the day at about the same time each day will help you control your blood glucose level. It can also help slow or prevent worsening of your diabetes mellitus. Healthy eating may even help you improve the level of your blood pressure and reach or maintain a healthy weight.  General recommendations for healthful eating and cooking habits include:  Eating meals and snacks  regularly. Avoid going long periods of time without eating to lose weight.  Eating a diet that consists mainly of plant-based foods, such as fruits, vegetables, nuts, legumes, and whole grains.  Using low-heat cooking methods, such as baking, instead of high-heat cooking methods, such  as deep frying. Work with your dietitian to make sure you understand how to use the Nutrition Facts information on food labels. HOW CAN FOOD AFFECT ME? Carbohydrates Carbohydrates affect your blood glucose level more than any other type of food. Your dietitian will help you determine how many carbohydrates to eat at each meal and teach you how to count carbohydrates. Counting carbohydrates is important to keep your blood glucose at a healthy level, especially if you are using insulin or taking certain medicines for diabetes mellitus. Alcohol Alcohol can cause sudden decreases in blood glucose (hypoglycemia), especially if you use insulin or take certain medicines for diabetes mellitus. Hypoglycemia can be a life-threatening condition. Symptoms of hypoglycemia (sleepiness, dizziness, and disorientation) are similar to symptoms of having too much alcohol.  If your health care provider has given you approval to drink alcohol, do so in moderation and use the following guidelines:  Women should not have more than one drink per day, and men should not have more than two drinks per day. One drink is equal to:  12 oz of beer.  5 oz of wine.  1 oz of hard liquor.  Do not drink on an empty stomach.  Keep yourself hydrated. Have water, diet soda, or unsweetened iced tea.  Regular soda, juice, and other mixers might contain a lot of carbohydrates and should be counted. WHAT FOODS ARE NOT RECOMMENDED? As you make food choices, it is important to remember that all foods are not the same. Some foods have fewer nutrients per serving than other foods, even though they might have the same number of calories or carbohydrates.  It is difficult to get your body what it needs when you eat foods with fewer nutrients. Examples of foods that you should avoid that are high in calories and carbohydrates but low in nutrients include:  Trans fats (most processed foods list trans fats on the Nutrition Facts label).  Regular soda.  Juice.  Candy.  Sweets, such as cake, pie, doughnuts, and cookies.  Fried foods. WHAT FOODS CAN I EAT? Eat nutrient-rich foods, which will nourish your body and keep you healthy. The food you should eat also will depend on several factors, including:  The calories you need.  The medicines you take.  Your weight.  Your blood glucose level.  Your blood pressure level.  Your cholesterol level. You should eat a variety of foods, including:  Protein.  Lean cuts of meat.  Proteins low in saturated fats, such as fish, egg whites, and beans. Avoid processed meats.  Fruits and vegetables.  Fruits and vegetables that may help control blood glucose levels, such as apples, mangoes, and yams.  Dairy products.  Choose fat-free or low-fat dairy products, such as milk, yogurt, and cheese.  Grains, bread, pasta, and rice.  Choose whole grain products, such as multigrain bread, whole oats, and brown rice. These foods may help control blood pressure.  Fats.  Foods containing healthful fats, such as nuts, avocado, olive oil, canola oil, and fish. DOES EVERYONE WITH DIABETES MELLITUS HAVE THE SAME MEAL PLAN? Because every person with diabetes mellitus is different, there is not one meal plan that works for everyone. It is very important that you meet with a dietitian who will help you create a meal plan that is just right for you.   This information is not intended to replace advice given to you by your health care provider. Make sure you discuss any questions you have with your health care provider.  Document Released: 06/30/2005 Document Revised: 10/24/2014 Document Reviewed:  08/30/2013 Elsevier Interactive Patient Education [employer]

## 2015-10-13 NOTE — Progress Notes (Signed)
Subjective:    Patient ID: Denise Christensen, female    DOB: Jun 21, 1956, 59 y.o.   MRN: [id number]  HPI [name], a 59 year old female presents accompanied by  niece to for follow-up of diabetes type 2. Patient primarily speaks [place], so she requires interpreter to assist with communication. Patient relocated from [place] 1 year ago.  Patient denies foot ulcerations, hyperglycemia, increase appetite, nausea, paresthesia of the feet, polydipsia, polyuria, visual disturbances, vomitting and weight loss.  Evaluation to date has included: hemoglobin A1C.  Home sugars: BGs consistently in an acceptable range.   Patient is also complaining of a wide spread rash for greater than 1 month Patient complains of generalized rash. She describes rash as itching. She states that rash has not changed since initial distribution. Patient was prescribed hydrocortisone lotion and given a steroid injection with minimal relief. Denies: arthralgia, congestion, cough, decrease in appetite, decrease in energy level, fever, headache, myalgia, nausea, sore throat and vomiting. Patient has not had previous evaluation of rash. She is using hydrocortisone cream with minimal relief. Patient has not had contacts with similar rash. Patient has not identified precipitant. Patient denies new exposures (soaps, lotions, laundry detergents, foods, medications, plants, insects or animals.)  Past Medical History  Diagnosis Date  . Diabetes mellitus without complication (HCC)   No Known Allergies    Medication List       This list is accurate as of: 10/13/15  7:41 AM.  Always use your most recent med list.               albuterol 108 (90 BASE) MCG/ACT inhaler  Commonly known as:  PROVENTIL HFA;VENTOLIN HFA  Inhale 2 puffs into the lungs every 4 (four) hours as needed for wheezing.     amitriptyline 25 MG tablet  Commonly known as:  ELAVIL  Take 25 mg by mouth at bedtime.     esomeprazole 40 MG capsule  Commonly  known as:  NEXIUM  Take 1 capsule (40 mg total) by mouth daily.     glipiZIDE 5 MG tablet  Commonly known as:  GLUCOTROL  Take 1 tablet (5 mg total) by mouth daily before breakfast.     glucose blood test strip  Commonly known as:  TRUE METRIX BLOOD GLUCOSE TEST  Use as instructed     guaiFENesin 600 MG 12 hr tablet  Commonly known as:  MUCINEX  Take 600 mg by mouth 2 (two) times daily as needed. For cough     hydrocortisone 1 % lotion  Apply 1 application topically 2 (two) times daily.     levothyroxine 25 MCG tablet  Commonly known as:  SYNTHROID, LEVOTHROID  Take 25 mcg by mouth daily before breakfast.     metFORMIN 1000 MG tablet  Commonly known as:  GLUCOPHAGE  Take 1 tablet (1,000 mg total) by mouth 2 (two) times daily with a meal.     OVER THE COUNTER MEDICATION  Take 30 mLs by mouth 3 (three) times daily as needed. For cough     SUMAtriptan 50 MG tablet  Commonly known as:  IMITREX  Take 50 mg by mouth every 2 (two) hours as needed for migraine. May repeat in 2 hours if headache persists or recurs.     topiramate 50 MG tablet  Commonly known as:  TOPAMAX  Take 50 mg by mouth 2 (two) times daily.     TRUE METRIX AIR GLUCOSE METER Devi  1 each by Does not apply route  daily.       Immunization History  Administered Date(s) Administered  . Influenza,inj,Quad PF,36+ Mos 07/13/2015  No Known Allergies Social History   Social History  . Marital Status: Married    Spouse Name: N/A  . Number of Children: N/A  . Years of Education: N/A   Occupational History  . Not on file.   Social History Main Topics  . Smoking status: Never Smoker   . Smokeless tobacco: Not on file  . Alcohol Use: No  . Drug Use: No  . Sexual Activity: Not on file   Other Topics Concern  . Not on file   Social History Narrative   Review of Systems  HENT: Negative for sinus pressure.   Eyes: Negative.  Negative for visual disturbance.  Respiratory: Negative.   Cardiovascular:  Negative.   Gastrointestinal: Negative for nausea.  Endocrine: Negative for polydipsia, polyphagia and polyuria.  Genitourinary: Negative.  Negative for hematuria, vaginal discharge and vaginal pain.  Musculoskeletal: Negative.   Skin: Positive for rash.  Allergic/Immunologic: Negative for immunocompromised state.  Neurological: Negative for dizziness and light-headedness.  Hematological: Negative.   Psychiatric/Behavioral: Negative.  Negative for suicidal ideas and sleep disturbance.       Objective:   Physical Exam  Constitutional: She appears well-developed and well-nourished. She is active.  Non-toxic appearance. She does not have a sickly appearance. No distress.  HENT:  Head: Normocephalic.  Right Ear: Hearing, tympanic membrane, external ear and ear canal normal.  Left Ear: Hearing, tympanic membrane and external ear normal.  Mouth/Throat: Uvula is midline and oropharynx is clear and moist.  Eyes: EOM and lids are normal. Pupils are equal, round, and reactive to light.  Neck: Normal range of motion. Neck supple.  Cardiovascular: Normal rate, normal heart sounds and intact distal pulses.   Pulmonary/Chest: Effort normal and breath sounds normal.  Abdominal: Soft. Bowel sounds are normal.  Musculoskeletal: Normal range of motion.  Neurological: She is alert.  Monofilament exam normal  Skin: Skin is warm, dry and intact. Rash (erythematous) noted. Rash is macular.  Hyperpigmentation below eyes  Psychiatric: She has a normal mood and affect. Her behavior is normal. Judgment and thought content normal.       BP 117/74 mmHg  Pulse 64  Temp(Src) 97.9 F (36.6 C) (Oral)  Resp 16  Ht 5\' 3"  (1.6 m)  Wt 141 lb (63.957 kg)  BMI 24.98 kg/m2 Assessment & Plan:   1. Type 2 diabetes mellitus without complication, without long-term current use of insulin (HCC) Reviewed daily CBGs, which have decreased over the past several months. Will continue medication at current dosages.  -  COMPLETE METABOLIC PANEL WITH GFR - Hemoglobin A1c - POCT urinalysis dipstick - glucose blood (TRUE METRIX BLOOD GLUCOSE TEST) test strip; Use as instructed  Dispense: 50 each; Refill: 12  2. Hypothyroidism, unspecified hypothyroidism type She is currently on levothyroxine 25 mcgs daily. Will check TSH on today.  - TSH  3.  Pruritic dermatitis Widespread, round, raised, erythematous maculopapular,  pruritic rash has been present for the past several months. Patient has not changed detergents, lotions, soaps or new medications. Patient received steroid injection during last office visit with minimal relief.  - Ambulatory referral to Dermatology  - hydrOXYzine (ATARAX/VISTARIL) 10 MG tablet; Take 1 tablet (10 mg total) by mouth 3 (three) times daily as needed.  Dispense: 30 tablet; Refill: 0  4. Hyperlipidemia - Lipid Panel - omega-3 acid ethyl esters (LOVAZA) 1 G capsule; Take 1 capsule (1  g total) by mouth 2 (two) times daily.  Dispense: 60 capsule; Refill: 5  5. Language barrier to communication Requires interpreter to assist with communication  RTC: 3 months for diabetes Massie Maroon, FNP

## 2015-10-23 ENCOUNTER — Ambulatory Visit: Payer: Self-pay

## 2015-11-16 ENCOUNTER — Ambulatory Visit: Payer: Self-pay | Attending: Internal Medicine

## 2016-01-12 ENCOUNTER — Ambulatory Visit: Payer: Self-pay | Admitting: Family Medicine

## 2016-04-21 ENCOUNTER — Ambulatory Visit: Payer: Self-pay | Attending: Internal Medicine

## 2016-08-11 ENCOUNTER — Encounter: Payer: Self-pay | Admitting: Family Medicine

## 2016-08-11 ENCOUNTER — Ambulatory Visit (INDEPENDENT_AMBULATORY_CARE_PROVIDER_SITE_OTHER): Payer: Self-pay | Admitting: Family Medicine

## 2016-08-11 ENCOUNTER — Encounter (INDEPENDENT_AMBULATORY_CARE_PROVIDER_SITE_OTHER): Payer: Self-pay

## 2016-08-11 VITALS — BP 120/71 | Temp 98.0°F | Resp 16 | Ht 63.0 in | Wt 143.0 lb

## 2016-08-11 DIAGNOSIS — E039 Hypothyroidism, unspecified: Secondary | ICD-10-CM

## 2016-08-11 DIAGNOSIS — E119 Type 2 diabetes mellitus without complications: Secondary | ICD-10-CM

## 2016-08-11 DIAGNOSIS — Z1231 Encounter for screening mammogram for malignant neoplasm of breast: Secondary | ICD-10-CM

## 2016-08-11 DIAGNOSIS — Z114 Encounter for screening for human immunodeficiency virus [HIV]: Secondary | ICD-10-CM

## 2016-08-11 DIAGNOSIS — Z23 Encounter for immunization: Secondary | ICD-10-CM

## 2016-08-11 DIAGNOSIS — Z1159 Encounter for screening for other viral diseases: Secondary | ICD-10-CM

## 2016-08-11 DIAGNOSIS — Z1239 Encounter for other screening for malignant neoplasm of breast: Secondary | ICD-10-CM

## 2016-08-11 LAB — CBC WITH DIFFERENTIAL/PLATELET
BASOS PCT: 0 %
Basophils Absolute: 0 cells/uL (ref 0–200)
EOS ABS: 243 {cells}/uL (ref 15–500)
EOS PCT: 3 %
HCT: 41 % (ref 35.0–45.0)
Hemoglobin: 13.7 g/dL (ref 11.7–15.5)
LYMPHS PCT: 44 %
Lymphs Abs: 3564 cells/uL (ref 850–3900)
MCH: 30.1 pg (ref 27.0–33.0)
MCHC: 33.4 g/dL (ref 32.0–36.0)
MCV: 90.1 fL (ref 80.0–100.0)
MONOS PCT: 6 %
MPV: 10.2 fL (ref 7.5–12.5)
Monocytes Absolute: 486 cells/uL (ref 200–950)
NEUTROS ABS: 3807 {cells}/uL (ref 1500–7800)
Neutrophils Relative %: 47 %
PLATELETS: 194 10*3/uL (ref 140–400)
RBC: 4.55 MIL/uL (ref 3.80–5.10)
RDW: 13.6 % (ref 11.0–15.0)
WBC: 8.1 10*3/uL (ref 3.8–10.8)

## 2016-08-11 LAB — TSH: TSH: 1.75 m[IU]/L

## 2016-08-11 LAB — COMPLETE METABOLIC PANEL WITH GFR
ALT: 52 U/L — AB (ref 6–29)
AST: 56 U/L — AB (ref 10–35)
Albumin: 4.2 g/dL (ref 3.6–5.1)
Alkaline Phosphatase: 75 U/L (ref 33–130)
BUN: 12 mg/dL (ref 7–25)
CALCIUM: 9.3 mg/dL (ref 8.6–10.4)
CHLORIDE: 102 mmol/L (ref 98–110)
CO2: 24 mmol/L (ref 20–31)
CREATININE: 0.69 mg/dL (ref 0.50–1.05)
GFR, Est African American: >89 mL/min (ref >=60)
GFR, Est Non African American: >89 mL/min (ref >=60)
GLUCOSE: 154 mg/dL — AB (ref 65–99)
POTASSIUM: 4.2 mmol/L (ref 3.5–5.3)
SODIUM: 137 mmol/L (ref 135–146)
Total Bilirubin: 0.7 mg/dL (ref 0.2–1.2)
Total Protein: 7.2 g/dL (ref 6.1–8.1)

## 2016-08-11 LAB — LIPID PANEL
CHOL/HDL RATIO: 2.9 ratio (ref <=5.0)
CHOLESTEROL: 126 mg/dL (ref 125–200)
HDL: 44 mg/dL — ABNORMAL LOW (ref >=46)
LDL CALC: 65 mg/dL (ref <130)
Triglycerides: 85 mg/dL (ref <150)
VLDL: 17 mg/dL (ref <30)

## 2016-08-11 LAB — POCT GLYCOSYLATED HEMOGLOBIN (HGB A1C): Hemoglobin A1C: 7.9

## 2016-08-11 MED ORDER — PRAVASTATIN SODIUM 40 MG PO TABS: 40.0000 mg | ORAL_TABLET | Freq: Every day | ORAL | 1 refills | Status: AC

## 2016-08-11 MED ORDER — LISINOPRIL 10 MG PO TABS: 10.0000 mg | ORAL_TABLET | Freq: Every day | ORAL | 1 refills | Status: DC

## 2016-08-11 NOTE — Patient Instructions (Signed)
Will call if anything in bloodwork needs attention. Have added a statin and ACE to meds.

## 2016-08-11 NOTE — Progress Notes (Signed)
Denise Sirenaliniben Drotar, is a 60 y.o. female  [id number]:[id number]  MWU:[id number]  DOB - 03/10/1956  CC:  Chief Complaint  Patient presents with  . Follow-up  . Diabetes       HPI: Denise Christensen is a 60 y.o. female here to follow-up chronic conditions. She has diagnoses of DM 2, hypothyroidism. History of MHA and anemia. She has not been seen here and had blood work since last December. She is on glipizide 5 mg daily, metformin 1000 bid, levothyroid 25 mcg daily, Lavoza, She is not currently on a statin or ACE/ARB. She reports with the help of her niece of generally doing well. She does report back pain.A1C was 7.0 in December, 7.9 today.   No Known Allergies Past Medical History:  Diagnosis Date  . Diabetes mellitus without complication [facility](HCC)    Current Outpatient Prescriptions on File Prior to Visit  Medication Sig Dispense Refill  . glipiZIDE (GLUCOTROL) 5 MG tablet Take 1 tablet (5 mg total) by mouth daily before breakfast. 30 tablet 5  . levothyroxine (SYNTHROID, LEVOTHROID) 25 MCG tablet Take 25 mcg by mouth daily before breakfast.    . metFORMIN (GLUCOPHAGE) 1000 MG tablet Take 1 tablet (1,000 mg total) by mouth 2 (two) times daily with a meal. 180 tablet 5  . albuterol (PROVENTIL HFA;VENTOLIN HFA) 108 (90 BASE) MCG/ACT inhaler Inhale 2 puffs into the lungs every 4 (four) hours as needed for wheezing. 3.7 g 0  . amitriptyline (ELAVIL) 25 MG tablet Take 25 mg by mouth at bedtime.    . Blood Glucose Monitoring Suppl (TRUE METRIX AIR GLUCOSE METER) DEVI 1 each by Does not apply route daily. (Patient not taking: Reported on 08/11/2016) 1 Device 0  . esomeprazole (NEXIUM) 40 MG capsule Take 1 capsule (40 mg total) by mouth daily. 30 capsule 0  . glucose blood (TRUE METRIX BLOOD GLUCOSE TEST) test strip Use as instructed (Patient not taking: Reported on 08/11/2016) 50 each 12  . guaiFENesin (MUCINEX) 600 MG 12 hr tablet Take 600 mg by mouth 2 (two) times daily as needed. Reported on 10/13/2015     . hydrocortisone 1 % lotion Apply 1 application topically 2 (two) times daily. (Patient not taking: Reported on 08/11/2016) 118 mL 3  . hydrOXYzine (ATARAX/VISTARIL) 10 MG tablet Take 1 tablet (10 mg total) by mouth 3 (three) times daily as needed. (Patient not taking: Reported on 08/11/2016) 30 tablet 0  . omega-3 acid ethyl esters (LOVAZA) 1 G capsule Take 1 capsule (1 g total) by mouth 2 (two) times daily. (Patient not taking: Reported on 08/11/2016) 60 capsule 5  . OVER THE COUNTER MEDICATION Take 30 mLs by mouth 3 (three) times daily as needed. For cough    . SUMAtriptan (IMITREX) 50 MG tablet Take 50 mg by mouth every 2 (two) hours as needed for migraine. May repeat in 2 hours if headache persists or recurs.    . topiramate (TOPAMAX) 50 MG tablet Take 50 mg by mouth 2 (two) times daily.     No current facility-administered medications on file prior to visit.    Family History  Problem Relation Age of Onset  . Kidney disease Father   . Diabetes Father   . Hypertension Father   . Hyperlipidemia Father    Social History   Social History  . Marital status: Married    Spouse name: N/A  . Number of children: N/A  . Years of education: N/A   Occupational History  . Not on file.  Social History Main Topics  . Smoking status: Never Smoker  . Smokeless tobacco: Never Used  . Alcohol use No  . Drug use: No  . Sexual activity: Not on file   Other Topics Concern  . Not on file   Social History Narrative  . No narrative on file    Review of Systems: Constitutional: + for fatigue Skin: Negative HENT: Negative  Eyes: Negative  Neck: Negative Respiratory: Negative Cardiovascular: Negative Gastrointestinal: Negative Genitourinary: + for nocturia  Musculoskeletal: +for back pain, chronic   Neurological: + for occ headache (advil) Hematological: Negative  Psychiatric/Behavioral: Negative    Objective:   Vitals:   08/11/16 1310  BP: 120/71  Resp: 16  Temp: 98 F  (36.7 C)    Physical Exam: Constitutional: Patient appears well-developed and well-nourished. No distress. HENT: Normocephalic, atraumatic, External right and left ear normal. Oropharynx is clear and moist.  Eyes: Conjunctivae and EOM are normal. PERRLA, no scleral icterus. Neck: Normal ROM. Neck supple. No lymphadenopathy, No thyromegaly. CVS: RRR, S1/S2 +, no murmurs, no gallops, no rubs Pulmonary: Effort and breath sounds normal, no stridor, rhonchi, wheezes, rales.  Abdominal: Soft. Normoactive BS,, no distension, tenderness, rebound or guarding.  Musculoskeletal: Normal range of motion. No edema and no tenderness.  Neuro: Alert.Normal muscle tone coordination. Non-focal Skin: Skin is warm and dry. No rash noted. Not diaphoretic. No erythema. No pallor. Psychiatric: Normal mood and affect. Behavior, judgment, thought content normal.  Lab Results  Component Value Date   WBC 7.8 07/13/2015   HGB 14.8 07/13/2015   HCT 41.8 07/13/2015   MCV 88.0 07/13/2015   PLT 202 07/13/2015   Lab Results  Component Value Date   CREATININE 0.62 10/13/2015   BUN 11 10/13/2015   NA 137 10/13/2015   K 4.0 10/13/2015   CL 104 10/13/2015   CO2 22 10/13/2015    Lab Results  Component Value Date   HGBA1C 7.9 08/11/2016   Lipid Panel     Component Value Date/Time   CHOL 189 10/13/2015 0907   TRIG 103 10/13/2015 0907   HDL 50 10/13/2015 0907   CHOLHDL 3.8 10/13/2015 0907   VLDL 21 10/13/2015 0907   LDLCALC 118 10/13/2015 0907       Assessment and plan:   1. Type 2 diabetes mellitus without complication, unspecified long term insulin use status (HCC)  - CBC with Differential - COMPLETE METABOLIC PANEL WITH GFR - POCT glycosylated hemoglobin (Hb A1C) - Lipid panel - Microalbumin, urine - Ambulatory referral to Ophthalmology  2. Hypothyroidism, unspecified type  - TSH  3. Screening for HIV (human immunodeficiency virus)  - HIV antibody (with reflex)  4. Encounter for  hepatitis C screening test for low risk patient  - Hepatitis C Antibody  5. Breast screening  - MM DIGITAL SCREENING BILATERAL; Future  6. Encounter for immunization  - Flu Vaccine QUAD 36+ mos IM  7. Back Pain   Return in about 3 months (around 11/11/2016).  The patient was given clear instructions to go to ER or return to medical center if symptoms don't improve, worsen or new problems develop. The patient verbalized understanding.    Henrietta Hoover FNP  08/12/2016, 9:36 AM

## 2016-08-12 ENCOUNTER — Telehealth: Payer: Self-pay

## 2016-08-12 ENCOUNTER — Other Ambulatory Visit: Payer: Self-pay | Admitting: Family Medicine

## 2016-08-12 DIAGNOSIS — IMO0001 Reserved for inherently not codable concepts without codable children: Secondary | ICD-10-CM

## 2016-08-12 DIAGNOSIS — E1165 Type 2 diabetes mellitus with hyperglycemia: Principal | ICD-10-CM

## 2016-08-12 LAB — MICROALBUMIN, URINE: MICROALB UR: 0.6 mg/dL

## 2016-08-12 LAB — HIV ANTIBODY (ROUTINE TESTING W REFLEX): HIV: NONREACTIVE

## 2016-08-12 LAB — HEPATITIS C ANTIBODY: HCV Ab: NEGATIVE

## 2016-08-12 MED ORDER — GLIPIZIDE 5 MG PO TABS
5.0000 mg | ORAL_TABLET | Freq: Two times a day (BID) | ORAL | 5 refills | Status: AC
Start: 2016-08-12 — End: ?

## 2016-08-12 NOTE — Telephone Encounter (Signed)
Patient called back and I advised of hgba1c and to increase glipizide to 2 tablets per day. Patient verbalized understanding. Thanks!

## 2016-08-12 NOTE — Telephone Encounter (Signed)
-----   Message from Henrietta HooverLinda C Bernhardt, NP sent at 08/12/2016  7:58 AM EDT ----- A1C 7.9, Micral 0.6. Have her increase glipizide to one in morning and one with dinner

## 2016-08-12 NOTE — Telephone Encounter (Signed)
Called, no answer. Left message for patient to call when available. Thanks!

## 2016-10-12 ENCOUNTER — Ambulatory Visit: Payer: No Typology Code available for payment source

## 2016-11-15 ENCOUNTER — Ambulatory Visit: Payer: No Typology Code available for payment source | Admitting: Family Medicine

## 2017-06-29 ENCOUNTER — Ambulatory Visit: Payer: Self-pay | Attending: Internal Medicine

## 2017-07-03 ENCOUNTER — Telehealth: Payer: Self-pay

## 2017-07-03 ENCOUNTER — Encounter: Payer: Self-pay | Admitting: Family Medicine

## 2017-07-03 ENCOUNTER — Ambulatory Visit (HOSPITAL_COMMUNITY)
Admission: RE | Admit: 2017-07-03 | Discharge: 2017-07-03 | Disposition: A | Discharge status: Home / Self Care | Payer: No Typology Code available for payment source | Source: Ambulatory Visit | Attending: Family Medicine | Admitting: Family Medicine

## 2017-07-03 ENCOUNTER — Ambulatory Visit (INDEPENDENT_AMBULATORY_CARE_PROVIDER_SITE_OTHER): Payer: Self-pay | Admitting: Family Medicine

## 2017-07-03 VITALS — BP 117/71 | HR 78 | Temp 97.9°F | Resp 14 | Ht 63.0 in | Wt 133.0 lb

## 2017-07-03 DIAGNOSIS — E039 Hypothyroidism, unspecified: Secondary | ICD-10-CM

## 2017-07-03 DIAGNOSIS — G8929 Other chronic pain: Secondary | ICD-10-CM | POA: Insufficient documentation

## 2017-07-03 DIAGNOSIS — M25511 Pain in right shoulder: Secondary | ICD-10-CM

## 2017-07-03 DIAGNOSIS — E119 Type 2 diabetes mellitus without complications: Secondary | ICD-10-CM

## 2017-07-03 DIAGNOSIS — E785 Hyperlipidemia, unspecified: Secondary | ICD-10-CM

## 2017-07-03 LAB — POCT URINALYSIS DIP (DEVICE)
BILIRUBIN URINE: NEGATIVE
GLUCOSE, UA: NEGATIVE mg/dL
Hgb urine dipstick: NEGATIVE
KETONES UR: NEGATIVE mg/dL
LEUKOCYTES UA: NEGATIVE
NITRITE: NEGATIVE
Protein, ur: NEGATIVE mg/dL
Specific Gravity, Urine: >=1.03 (ref 1.005–1.030)
Urobilinogen, UA: 0.2 mg/dL (ref 0.0–1.0)
pH: 5.5 (ref 5.0–8.0)

## 2017-07-03 LAB — POCT GLYCOSYLATED HEMOGLOBIN (HGB A1C): HEMOGLOBIN A1C: 6

## 2017-07-03 MED ORDER — LISINOPRIL 2.5 MG PO TABS: 2.5000 mg | ORAL_TABLET | Freq: Every day | ORAL | 11 refills | Status: AC

## 2017-07-03 MED ORDER — METFORMIN HCL 1000 MG PO TABS: 500.0000 mg | ORAL_TABLET | Freq: Two times a day (BID) | ORAL | 5 refills | Status: AC

## 2017-07-03 NOTE — Telephone Encounter (Signed)
Called no answer. Left a message for patient to return call. Thanks!  

## 2017-07-03 NOTE — Patient Instructions (Signed)
I will review xray for right shoulder pain. Will also send a referral to sports medicine for further workup and evaluation Apply warm compresses to right shoulder Tylenol 500 mg every 6 hours for mild to moderate pain   Your hemoglobin a1c has improved to 6.0.Will discontinue glipizide. Will continue Metformin 500 mg twice daily with breakfast and dinner.   Will also decrease Lisinopril to 2.5 mg daily   I will follow up by phone with any laboratory results

## 2017-07-03 NOTE — Progress Notes (Signed)
Subjective:    Patient ID: Denise Christensen, female    DOB: 01-Jan-1956, 61 y.o.   MRN: [id number]  HPI Denise Christensen, a 61 year old female presents accompanied by niece for a follow up of chronic conditions.   Diabetes Mellitus: Patient presents for follow up of diabetes. She has been taking antidiabetic medications consistently. She does not exercise routinely, but remains active. She  Patient denies foot ulcerations, hyperglycemia, increase appetite, nausea, paresthesia of the feet, polydipsia, polyuria, visual disturbances, vomitting and weight loss.  Evaluation to date has been included: hemoglobin A1C.    Hypothyroidism: Patient also has a history of acquired hypothyroidism. Symptoms have been controlled on levothyroxine. Previous thyroid studies include TSH, which was within normal limits.   Shoulder Pain: Patient complains  of right shoulder pain. She denies recent injury. She describes pain as intermittent and aching. Pain is aggravated by reaching above her head, feeding her young nephew and applying clothing. She says that pain has worsened over the past month. She had a repair of left shoulder while living in [place]. She has not attempted any OTC interventions to alleviate symptoms.   Past Medical History:  Diagnosis Date  . Diabetes mellitus without complication [facility])    Social History   Social History  . Marital status: Married    Spouse name: N/A  . Number of children: N/A  . Years of education: N/A   Occupational History  . Not on file.   Social History Main Topics  . Smoking status: Never Smoker  . Smokeless tobacco: Never Used  . Alcohol use No  . Drug use: No  . Sexual activity: Not on file   Other Topics Concern  . Not on file   Social History Narrative  . No narrative on file   Immunization History  Administered Date(s) Administered  . Influenza,inj,Quad PF,6+ Mos 07/13/2015, 08/11/2016        Review of Systems  Constitutional: Negative for  fatigue and fever.  HENT: Negative.   Eyes: Negative.   Respiratory: Negative.   Cardiovascular: Negative for chest pain, palpitations and leg swelling.  Gastrointestinal: Negative.   Endocrine: Negative for polydipsia, polyphagia and polyuria.  Genitourinary: Negative.   Musculoskeletal: Positive for arthralgias (Right shoulder pain).  Allergic/Immunologic: Negative.   Neurological: Negative.   Hematological: Negative.   Psychiatric/Behavioral: Negative.        Objective:   Physical Exam  Constitutional: She is oriented to person, place, and time. She appears well-developed and well-nourished.  HENT:  Head: Normocephalic and atraumatic.  Right Ear: External ear normal.  Left Ear: External ear normal.  Mouth/Throat: Oropharynx is clear and moist.  Eyes: Pupils are equal, round, and reactive to light. Conjunctivae and EOM are normal.  Neck: Normal range of motion. Neck supple.  Cardiovascular: Normal rate, regular rhythm, normal heart sounds and intact distal pulses.   Pulmonary/Chest: Effort normal and breath sounds normal.  Abdominal: Soft. Bowel sounds are normal.  Musculoskeletal:       Right shoulder: She exhibits decreased range of motion, tenderness, pain and decreased strength (3/5 strength). She exhibits no swelling and no spasm.  Neurological: She is alert and oriented to person, place, and time.  Skin: Skin is warm and dry.  Psychiatric: She has a normal mood and affect. Her behavior is normal. Judgment and thought content normal.         BP 117/71 (BP Location: Left Arm, Patient Position: Sitting, Cuff Size: Normal)   Pulse 78   Temp 97.9  F (36.6 C) (Oral)   Resp 14   Ht  (1.6 m)   Wt 133 lb (60.3 kg)   SpO2 99%   BMI 23.56 kg/m  Assessment & Plan:  1. Type 2 diabetes mellitus without complication, without long-term current use of insulin (HCC) Hemoglobin a1C has decreased to 6.0. Will discontinue glipizide and decrease Metformin to 500 mg BID with  meals.  Recommend a lowfat, low carbohydrate diet divided over 5-6 small meals, increase water intake to 6-8 glasses, and 150 minutes per week of cardiovascular exercise.   - HgB A1c - metFORMIN (GLUCOPHAGE) 1000 MG tablet; Take 0.5 tablets (500 mg total) by mouth 2 (two) times daily with a meal.  Dispense: 180 tablet; Refill: 5 - COMPLETE METABOLIC PANEL WITH GFR - lisinopril (PRINIVIL,ZESTRIL) 2.5 MG tablet; Take 1 tablet (2.5 mg total) by mouth daily.  Dispense: 30 tablet; Refill: 11 - POCT urinalysis dip (device)  2. Acute pain of right shoulder I will follow up by phone after reviewing imaging.  Patietnt warrants referral to sports medicine due to history of frozen shoulder.  She may benefit from PT, I will defer to specialists for further workup and evaluation - DG Shoulder Right; Future - AMB referral to sports medicine  3. Hypothyroidism, unspecified type Will continue Levothyroxine at current dosage.  - TSH  4. Hyperlipidemia, unspecified hyperlipidemia type - Lipid Panel   RTC: 6 months for chronic conditions   Nolon Nations  MSN, FNP-C Patient Care [facility] [address] [place], [place] [address] [phone]

## 2017-07-03 NOTE — Telephone Encounter (Signed)
-----   Message from Massie Maroon, [place] sent at 07/03/2017 11:50 AM EDT ----- Regarding: lab results Please inform patient that right shoulder xray showed mild degenerative changes to right shoulder. Refrain from lifting items greater than 20 pounds. Apply warm, moist compresses to right shoulder as needed. Also, Tylenol 500 mg every 6 hours as needed for mild to moderate pain. A referral has been sent to sports medicine for further work up and evaluation.   Thanks ----- Message ----- From: Interface, Rad Results In Sent: 07/03/2017  10:02 AM To: Massie Maroon, FNP

## 2017-07-04 LAB — COMPLETE METABOLIC PANEL WITH GFR
AG RATIO: 1.3 (calc) (ref 1.0–2.5)
ALBUMIN MSPROF: 4.1 g/dL (ref 3.6–5.1)
ALKALINE PHOSPHATASE (APISO): 63 U/L (ref 33–130)
ALT: 18 U/L (ref 6–29)
AST: 31 U/L (ref 10–35)
BILIRUBIN TOTAL: 0.8 mg/dL (ref 0.2–1.2)
BUN: 11 mg/dL (ref 7–25)
CHLORIDE: 105 mmol/L (ref 98–110)
CO2: 22 mmol/L (ref 20–32)
Calcium: 9.4 mg/dL (ref 8.6–10.4)
Creat: 0.62 mg/dL (ref 0.50–0.99)
GFR, EST AFRICAN AMERICAN: 114 mL/min/{1.73_m2} (ref >=60)
GFR, Est Non African American: 98 mL/min/{1.73_m2} (ref >=60)
GLOBULIN: 3.2 g/dL (ref 1.9–3.7)
GLUCOSE: 90 mg/dL (ref 65–99)
POTASSIUM: 4.4 mmol/L (ref 3.5–5.3)
SODIUM: 139 mmol/L (ref 135–146)
TOTAL PROTEIN: 7.3 g/dL (ref 6.1–8.1)

## 2017-07-04 LAB — TSH: TSH: 2.45 m[IU]/L (ref 0.40–4.50)

## 2017-07-04 LAB — LIPID PANEL
Cholesterol: 192 mg/dL (ref <200)
HDL: 46 mg/dL — AB (ref >50)
LDL CHOLESTEROL (CALC): 120 mg/dL — AB
Non-HDL Cholesterol (Calc): 146 mg/dL (calc) — ABNORMAL HIGH (ref <130)
TRIGLYCERIDES: 149 mg/dL (ref <150)
Total CHOL/HDL Ratio: 4.2 (calc) (ref <5.0)

## 2017-07-04 NOTE — Telephone Encounter (Signed)
Called and spoke with patient's niece (Sapana) *Authorization on file*, advised that xray to right shoulder shows mild degenerative changes to shoulder, that patient should refrain from lifting items greater than 20 lbs, can use warm compresses and Tylenol  every 6 hours as needed for pain, and that we are sending a referral to sports medicine for further work up. Sapana verbalized understanding and will advise patient. Thanks!

## 2017-07-06 ENCOUNTER — Telehealth: Payer: Self-pay

## 2017-07-06 NOTE — Telephone Encounter (Signed)
Called, no answer. Left a message for patient to call back. Thanks!  

## 2017-07-06 NOTE — Telephone Encounter (Signed)
-----   Message from Massie Maroon, [place] sent at 07/06/2017  5:37 AM EDT ----- Regarding: lab results Please inform patient that LDL cholesterol remains elevated, will continue statin therapy as previously prescribed. Recommend a lowfat, low carbohydrate diet divided over 5-6 small meals, increase water intake to 6-8 glasses, and 150 minutes per week of cardiovascular exercise.    Thanks ----- Message ----- From: Loney Hering, LPN Sent: 1/61/0960   8:26 AM To: Massie Maroon, FNP

## 2017-07-07 ENCOUNTER — Telehealth: Payer: Self-pay

## 2017-07-11 NOTE — Telephone Encounter (Signed)
Called and spoke with patients niece (authoirzation on file) advised of the elevated cholesterol panel and that she should continue her statin therapy as will as working on her diet and exercise. Advised to eat a low fat/ low carb diet over 5 to 6 small meals daily, asked that she drink 6 to 8 glasses of water daily and exercise 150 minutes a week of cardio. Thanks!

## 2017-10-06 ENCOUNTER — Other Ambulatory Visit: Payer: No Typology Code available for payment source

## 2018-05-02 ENCOUNTER — Ambulatory Visit: Payer: No Typology Code available for payment source | Attending: Family Medicine

## 2018-06-20 ENCOUNTER — Ambulatory Visit: Payer: Self-pay | Admitting: Family Medicine

## 2018-08-13 ENCOUNTER — Ambulatory Visit: Payer: Self-pay | Admitting: Family Medicine

## 2018-10-08 IMAGING — DX DG SHOULDER 2+V*R*
1 series · 1 of 1 positions shown · non-contrast
Comparison: None.

CLINICAL DATA: No known injury, right shoulder pain

EXAM:
RIGHT SHOULDER - 2+ VIEW

[shoulder axial]
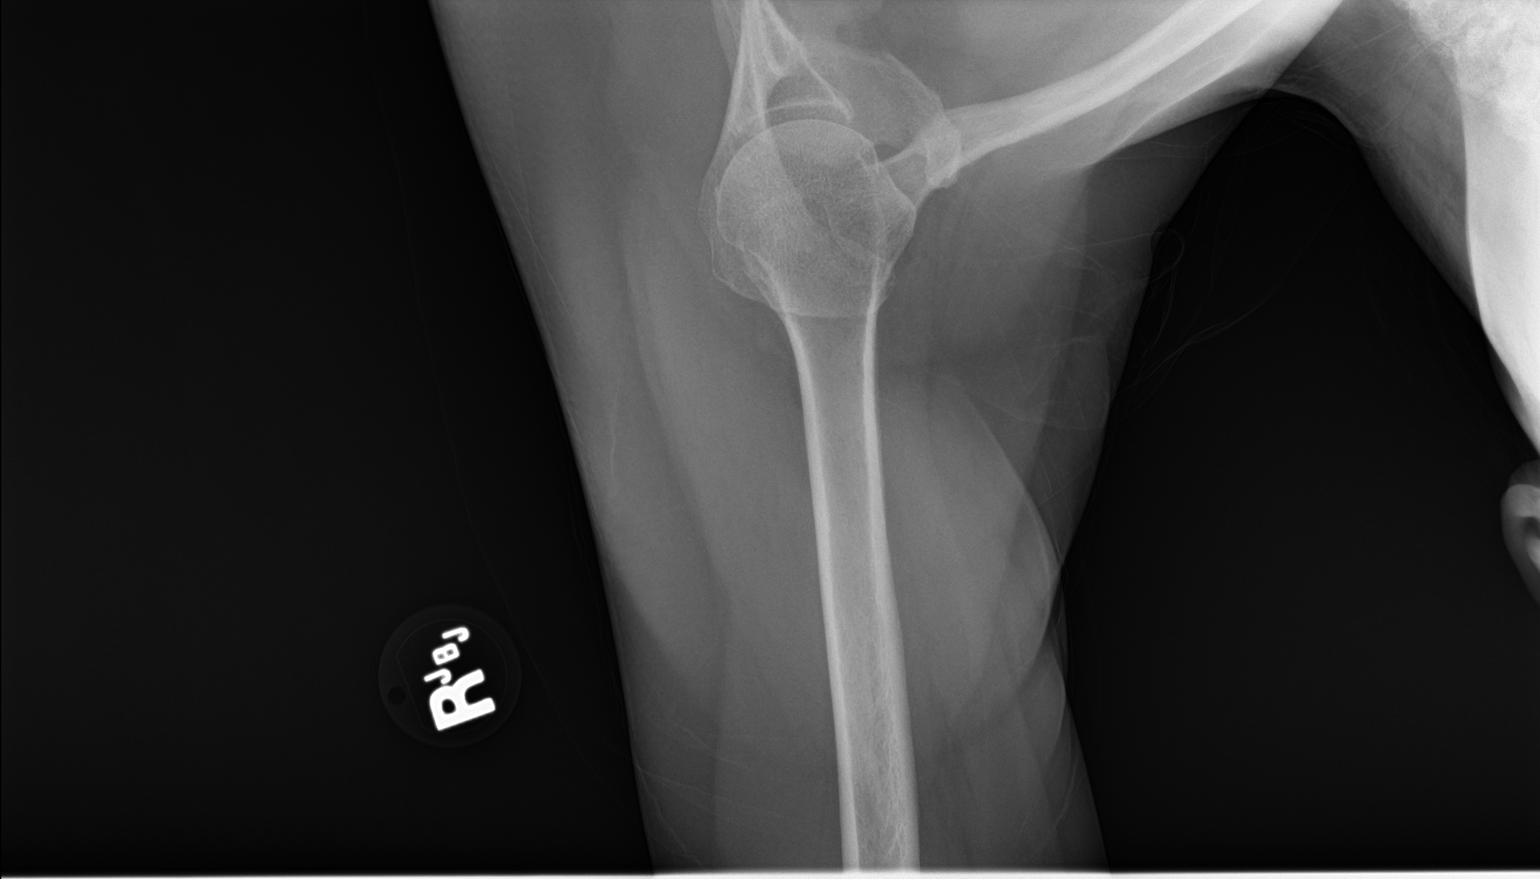

[1 of 1 positions shown; findings below may reference images not displayed]

FINDINGS: There is no fracture or dislocation. There are mild degenerative
changes of the acromioclavicular joint.
IMPRESSION: No acute osseous injury of the right shoulder.

## 2019-02-12 NOTE — Telephone Encounter (Signed)
Message sent to provider
